# Patient Record
Sex: Male | Born: 1935 | Race: White | Hispanic: No | Marital: Single | Smoking: Never smoker
Health system: Southern US, Community
[De-identification: ages and names within clinical notes are randomized; demographics above are authoritative.]

## PROBLEM LIST (undated history)

## (undated) DIAGNOSIS — R410 Disorientation, unspecified: Secondary | ICD-10-CM

## (undated) DIAGNOSIS — I639 Cerebral infarction, unspecified: Secondary | ICD-10-CM

---

## 2011-01-25 ENCOUNTER — Emergency Department (HOSPITAL_COMMUNITY)
Admission: EM | Admit: 2011-01-25 | Discharge: 2011-01-25 | Disposition: A | Discharge status: Skilled Nursing Facility | Payer: Medicare Other | Attending: Emergency Medicine | Admitting: Emergency Medicine

## 2011-01-25 ENCOUNTER — Encounter: Payer: Self-pay | Admitting: *Deleted

## 2011-01-25 DIAGNOSIS — R5383 Other fatigue: Secondary | ICD-10-CM | POA: Insufficient documentation

## 2011-01-25 DIAGNOSIS — Z9181 History of falling: Secondary | ICD-10-CM | POA: Insufficient documentation

## 2011-01-25 DIAGNOSIS — W19XXXA Unspecified fall, initial encounter: Secondary | ICD-10-CM

## 2011-01-25 DIAGNOSIS — Z8673 Personal history of transient ischemic attack (TIA), and cerebral infarction without residual deficits: Secondary | ICD-10-CM | POA: Insufficient documentation

## 2011-01-25 DIAGNOSIS — R1310 Dysphagia, unspecified: Secondary | ICD-10-CM | POA: Insufficient documentation

## 2011-01-25 DIAGNOSIS — R5381 Other malaise: Secondary | ICD-10-CM | POA: Insufficient documentation

## 2011-01-25 HISTORY — DX: Cerebral infarction, unspecified: I63.9

## 2011-01-25 LAB — URINALYSIS, ROUTINE W REFLEX MICROSCOPIC
Glucose, UA: NEGATIVE mg/dL
Hgb urine dipstick: NEGATIVE
Ketones, ur: NEGATIVE mg/dL
Protein, ur: NEGATIVE mg/dL
pH: 5 (ref 5.0–8.0)

## 2011-01-25 LAB — URINE MICROSCOPIC-ADD ON

## 2011-01-25 NOTE — ED Notes (Signed)
Report called to Nursing home, awaiting transport

## 2011-01-25 NOTE — ED Provider Notes (Signed)
History     CSN: [id number] Arrival date & time: 01/25/2011  3:25 PM   None     No chief complaint on file.   (Consider location/radiation/quality/duration/timing/severity/associated sxs/prior treatment) HPI Comments: Pt with history of recent stroke sent from nursing home over concern of falls and dysphagia. Patient without complaints, denies cough or pain from falls. Pt is not on blood thinner.  Patient is a 77 y.o. male presenting with weakness. The history is provided by the EMS personnel, the nursing home and the patient.  Weakness Primary symptoms do not include headaches, loss of consciousness, altered mental status, fever, nausea or vomiting. The symptoms began 5 to 7 days ago. The symptoms are unchanged. The neurological symptoms are diffuse. Context: pt with recent CVA.  Additional symptoms include weakness and loss of balance. Medical issues also include cerebral vascular accident.    No past medical history on file.  No past surgical history on file.  No family history on file.  History  Substance Use Topics  . Smoking status: Not on file  . Smokeless tobacco: Not on file  . Alcohol Use: Not on file      Review of Systems  Constitutional: Negative for fever.  Respiratory: Negative for cough and shortness of breath.   Cardiovascular: Negative for chest pain.  Gastrointestinal: Negative for nausea, vomiting, abdominal pain and diarrhea.  Neurological: Positive for weakness and loss of balance. Negative for loss of consciousness and headaches.  Psychiatric/Behavioral: Negative for altered mental status.  All other systems reviewed and are negative.    Allergies  Review of patient's allergies indicates not on file.  Home Medications  No current outpatient prescriptions on file.  There were no vitals taken for this visit.  Physical Exam  Nursing note and vitals reviewed. Constitutional: He is oriented to person, place, and time. He appears  well-developed and well-nourished. No distress.  HENT:  Head: Normocephalic and atraumatic.  Mouth/Throat: Oropharynx is clear and moist.  Eyes: EOM are normal. Pupils are equal, round, and reactive to light.  Cardiovascular: Normal rate, regular rhythm and normal heart sounds.   Pulmonary/Chest: Effort normal and breath sounds normal. No respiratory distress. He has no wheezes. He has no rales. He exhibits no tenderness.  Abdominal: Soft. He exhibits no distension. There is no tenderness.  Musculoskeletal: Normal range of motion.  Neurological: He is alert and oriented to person, place, and time. No cranial nerve deficit. He exhibits normal muscle tone.  Skin: Skin is warm and dry.  Psychiatric: He has a normal mood and affect.    ED Course  Procedures (including critical care time)  Labs Reviewed - No data to display No results found.   No diagnosis found.    MDM   3:26 PM Pt seen and examined. Report from nursing home is that patient has been falling more and is appearing red any time he tries to swallow something. At this time, patient is without cough or fever. He does not appear septic. He does not appear to have had another stroke, just his previous deficits are noted. Will check urine as unable to ensure this is not causing worsening of underlying neuro deficits from stroke on physical exam.  Pt without evidence of urinary infection. Will return to nursing home and advise follow up with PCP for any further concerns.         Daleen Bo 01/26/11 7829

## 2011-01-25 NOTE — ED Notes (Signed)
Pt. Sent in from [facility]. Pt. Alert and oriented, speech  slurred but comprehensible, pt. Denies pain or nausea

## 2011-01-26 LAB — URINE CULTURE
Colony Count: NO GROWTH
Culture  Setup Time: 201211130106
Culture: NO GROWTH

## 2011-01-26 NOTE — ED Provider Notes (Signed)
I saw and evaluated the patient, reviewed the resident's note and I agree with the findings and plan. 78 year old male  he recently had a stroke.  He is in a rehabilitation facility where he has fallen out of the bed.  A few times.  He is in no distress.  He has no evidence of trauma to his head to neck.  Extremities, or thorax or abdomen.  There is no indication for CAT scans or radiographs of his extremities.  There is no evidence of an acute illness.  Will release him back to the nursing home for continued rehabilitation  Nicholes Stairs, MD 01/26/11 1552

## 2011-01-29 ENCOUNTER — Other Ambulatory Visit (HOSPITAL_COMMUNITY): Payer: Self-pay | Admitting: *Deleted

## 2011-01-29 DIAGNOSIS — I639 Cerebral infarction, unspecified: Secondary | ICD-10-CM

## 2011-01-29 DIAGNOSIS — T17998A Other foreign object in respiratory tract, part unspecified causing other injury, initial encounter: Secondary | ICD-10-CM

## 2011-02-03 ENCOUNTER — Ambulatory Visit (HOSPITAL_COMMUNITY)
Admission: RE | Admit: 2011-02-03 | Discharge: 2011-02-03 | Disposition: A | Discharge status: Home / Self Care | Payer: Medicare Other | Source: Ambulatory Visit | Attending: Internal Medicine | Admitting: Internal Medicine

## 2011-02-03 DIAGNOSIS — I639 Cerebral infarction, unspecified: Secondary | ICD-10-CM

## 2011-02-03 DIAGNOSIS — R1312 Dysphagia, oropharyngeal phase: Secondary | ICD-10-CM | POA: Insufficient documentation

## 2011-02-03 DIAGNOSIS — T17208A Unspecified foreign body in pharynx causing other injury, initial encounter: Secondary | ICD-10-CM | POA: Insufficient documentation

## 2011-02-03 DIAGNOSIS — Z8673 Personal history of transient ischemic attack (TIA), and cerebral infarction without residual deficits: Secondary | ICD-10-CM | POA: Insufficient documentation

## 2011-02-03 DIAGNOSIS — IMO0002 Reserved for concepts with insufficient information to code with codable children: Secondary | ICD-10-CM | POA: Insufficient documentation

## 2011-02-03 DIAGNOSIS — T17998A Other foreign object in respiratory tract, part unspecified causing other injury, initial encounter: Secondary | ICD-10-CM

## 2011-02-03 NOTE — Procedures (Signed)
Modified Barium Swallow Procedure Note Patient Details  Name: Nicholas Baxter MRN: [id number] Date of Birth: 18-Nov-1918  Today's Date: 02/03/2011   Past Medical History:  Past Medical History  Diagnosis Date  . Stroke    Past Surgical History: No past surgical history on file.    Recommendation/Prognosis  Clinical Impression Dysphagia Diagnosis: Moderate oral phase dysphagia;Severe pharyngeal phase dysphagia Clinical impression: Patient presents with a moderately severe sensory-motor based oropharyngeal dysphagia characterized by right sided oral weakness resulting in decreased bolus cohesion, delayed A-P transit, and loss of bolus over the base of the tongue followed by oro-pharyngeal and laryngeal weakness resulting in severe pharyngeal residuals post swallow. The combination of above results in aspiration of nectar thick liquids before the swallow and aspiration of honey thick and pureed residuals post swallow. Patient largely unaware of residuals, therefore not making attempts to clear with dry swallow. Chin tuck minimally effective to decrease degree of residuals. Additionally, patient with only intermittent sensation of aspiration. Cough ineffective to fully clear. Discussed results with son who verbalized understanding of high aspiration risk. At this time, current diet of pureed solids and honey thick liquids is least restrictive however aspiration risk continues to remain high.   Recommendations Solid Consistency: Dysphagia 1 (Puree) Liquid Consistency: Honey Liquid Administration via: Cup Medication Administration: Crushed with puree Supervision: Full supervision/cueing for compensatory strategies Compensations: Slow rate;Small sips/bites;Check for pocketing Postural Changes and/or Swallow Maneuvers: Chin tuck;Seated upright 90 degrees;Upright 30-60 min after meal Oral Care Recommendations: Oral care QID Prognosis Prognosis for Safe Diet Advancement: Guarded Barriers to Reach  Goals: Severity of dysphagia (advanced age) Individuals Consulted Consulted and Agree with Results and Recommendations: Family member/caregiver;Patient Family Member Consulted: son Report Sent to : Referring physician  Ferdinand Lango MA, CCC-SLP [phone] Name change in 07/2010. Correct on license as Ferdinand Lango MA, CCC-SLP   Myrtis Hopping Meryl 02/03/2011, 2:09 PM

## 2011-02-08 ENCOUNTER — Emergency Department (HOSPITAL_COMMUNITY): Payer: Medicare Other

## 2011-02-08 ENCOUNTER — Encounter (HOSPITAL_COMMUNITY): Payer: Self-pay

## 2011-02-08 ENCOUNTER — Other Ambulatory Visit: Payer: Self-pay

## 2011-02-08 ENCOUNTER — Inpatient Hospital Stay (HOSPITAL_COMMUNITY)
Admission: EM | Admit: 2011-02-08 | Discharge: 2011-02-12 | DRG: 689 | Disposition: A | Discharge status: Skilled Nursing Facility | Payer: Medicare Other | Attending: Internal Medicine | Admitting: Internal Medicine

## 2011-02-08 DIAGNOSIS — T4275XA Adverse effect of unspecified antiepileptic and sedative-hypnotic drugs, initial encounter: Secondary | ICD-10-CM | POA: Diagnosis present

## 2011-02-08 DIAGNOSIS — G9340 Encephalopathy, unspecified: Secondary | ICD-10-CM

## 2011-02-08 DIAGNOSIS — I10 Essential (primary) hypertension: Secondary | ICD-10-CM | POA: Diagnosis present

## 2011-02-08 DIAGNOSIS — I69991 Dysphagia following unspecified cerebrovascular disease: Secondary | ICD-10-CM

## 2011-02-08 DIAGNOSIS — R1310 Dysphagia, unspecified: Secondary | ICD-10-CM | POA: Diagnosis present

## 2011-02-08 DIAGNOSIS — I69922 Dysarthria following unspecified cerebrovascular disease: Secondary | ICD-10-CM

## 2011-02-08 DIAGNOSIS — Z66 Do not resuscitate: Secondary | ICD-10-CM | POA: Diagnosis present

## 2011-02-08 DIAGNOSIS — E860 Dehydration: Secondary | ICD-10-CM | POA: Diagnosis present

## 2011-02-08 DIAGNOSIS — R4182 Altered mental status, unspecified: Secondary | ICD-10-CM

## 2011-02-08 DIAGNOSIS — Z8673 Personal history of transient ischemic attack (TIA), and cerebral infarction without residual deficits: Secondary | ICD-10-CM | POA: Insufficient documentation

## 2011-02-08 DIAGNOSIS — I714 Abdominal aortic aneurysm, without rupture: Secondary | ICD-10-CM | POA: Diagnosis present

## 2011-02-08 DIAGNOSIS — N390 Urinary tract infection, site not specified: Principal | ICD-10-CM | POA: Diagnosis present

## 2011-02-08 HISTORY — DX: Disorientation, unspecified: R41.0

## 2011-02-08 LAB — RAPID URINE DRUG SCREEN, HOSP PERFORMED
Amphetamines: NOT DETECTED
Barbiturates: NOT DETECTED
Opiates: NOT DETECTED
Tetrahydrocannabinol: NOT DETECTED

## 2011-02-08 LAB — COMPREHENSIVE METABOLIC PANEL
Albumin: 3.4 g/dL — ABNORMAL LOW (ref 3.5–5.2)
Alkaline Phosphatase: 85 U/L (ref 39–117)
BUN: 21 mg/dL (ref 6–23)
CO2: 29 mEq/L (ref 19–32)
Chloride: 103 mEq/L (ref 96–112)
Creatinine, Ser: 1.43 mg/dL — ABNORMAL HIGH (ref 0.50–1.35)
GFR calc non Af Amer: 42 mL/min — ABNORMAL LOW (ref >90)
Glucose, Bld: 115 mg/dL — ABNORMAL HIGH (ref 70–99)
Potassium: 4 mEq/L (ref 3.5–5.1)
Total Bilirubin: 0.4 mg/dL (ref 0.3–1.2)

## 2011-02-08 LAB — AMMONIA: Ammonia: 23 umol/L (ref 11–60)

## 2011-02-08 LAB — DIFFERENTIAL
Basophils Relative: 0 % (ref 0–1)
Lymphocytes Relative: 18 % (ref 12–46)
Lymphs Abs: 1.2 10*3/uL (ref 0.7–4.0)
Monocytes Absolute: 0.4 10*3/uL (ref 0.1–1.0)
Monocytes Relative: 7 % (ref 3–12)
Neutro Abs: 4.7 10*3/uL (ref 1.7–7.7)
Neutrophils Relative %: 73 % (ref 43–77)

## 2011-02-08 LAB — URINALYSIS, ROUTINE W REFLEX MICROSCOPIC
Bilirubin Urine: NEGATIVE
Glucose, UA: NEGATIVE mg/dL
Specific Gravity, Urine: 1.025 (ref 1.005–1.030)

## 2011-02-08 LAB — CBC
HCT: 37.1 % — ABNORMAL LOW (ref 39.0–52.0)
Hemoglobin: 11.7 g/dL — ABNORMAL LOW (ref 13.0–17.0)
RBC: 3.65 MIL/uL — ABNORMAL LOW (ref 4.22–5.81)
WBC: 6.4 10*3/uL (ref 4.0–10.5)

## 2011-02-08 LAB — ETHANOL: Alcohol, Ethyl (B): <11 mg/dL (ref 0–11)

## 2011-02-08 LAB — URINE MICROSCOPIC-ADD ON

## 2011-02-08 MED ORDER — NALOXONE HCL 0.4 MG/ML IJ SOLN
INTRAMUSCULAR | Status: AC
  Administered 2011-02-08: 0.4 mg
  Filled 2011-02-08: qty 1

## 2011-02-08 MED ORDER — SODIUM CHLORIDE 0.9 % IV BOLUS (SEPSIS)
500.0000 mL | INTRAVENOUS | Status: AC
  Administered 2011-02-08: 500 mL via INTRAVENOUS

## 2011-02-08 MED ORDER — SODIUM CHLORIDE 0.45 % IV SOLN
INTRAVENOUS | Status: DC
  Administered 2011-02-09 – 2011-02-11 (×4): via INTRAVENOUS

## 2011-02-08 MED ORDER — ENOXAPARIN SODIUM 40 MG/0.4ML ~~LOC~~ SOLN
40.0000 mg | SUBCUTANEOUS | Status: DC
  Administered 2011-02-09 – 2011-02-11 (×4): 40 mg via SUBCUTANEOUS
  Filled 2011-02-08 (×7): qty 0.4

## 2011-02-08 MED ORDER — DEXTROSE 5 % IV SOLN
1.0000 g | Freq: Once | INTRAVENOUS | Status: AC
  Administered 2011-02-08: 1 g via INTRAVENOUS
  Filled 2011-02-08: qty 10

## 2011-02-08 MED ORDER — NALOXONE HCL 0.4 MG/ML IJ SOLN
INTRAMUSCULAR | Status: AC
  Filled 2011-02-08: qty 1

## 2011-02-08 NOTE — ED Notes (Signed)
FAO:ZHYQ<MV> Expected date:02/08/11<BR> Expected time:12:01 PM<BR> Means of arrival:Ambulance<BR> Comments:<BR> M32 - 89yoM Decreased LOC

## 2011-02-08 NOTE — ED Notes (Signed)
Per EMS.Marland KitchenMarland KitchenPt was reported to have a sudden onset of altered mental status at around 1115 this morning.  When they arrived he was not responding and his RR was 8 and was sinus brady in the 40s.  He was 84% on room air.  He was 100% on NRB.  They gave 2mg  Narcan at 12pm.  His HR picked up and he was opening his eyes at that time.  He had had some pain meds this AM.  Hx dementia and HTN.

## 2011-02-08 NOTE — ED Provider Notes (Cosign Needed Addendum)
History     CSN: [id number] Arrival date & time: 02/08/2011 12:11 PM   First MD Initiated Contact with Patient 02/08/11 1232      Chief Complaint  Patient presents with  . Altered Mental Status   elderly male, with a known history of recent stroke. Presents from the nursing home, last seen, normal at 11:15 this morning. Staff went to check on patient and found him not responding with a low respiratory rate and heart rate in the 40s. Patient was placed on percent nonrebreather and was given 2 mg of Narcan. Apparently, his mental status improved somewhat. He does have a known history of dementia and hypertension. Patient is unable to answer any other questions at this time. Appears lethargic, however, it is beginning to move his arms. His baseline mental status is unknown. He is slightly hypertensive in the ED, 186/82. No fever. No history of trauma.  (Consider location/radiation/quality/duration/timing/severity/associated sxs/prior treatment) HPI  Past Medical History  Diagnosis Date  . Stroke     History reviewed. No pertinent past surgical history.  History reviewed. No pertinent family history.  History  Substance Use Topics  . Smoking status: Not on file  . Smokeless tobacco: Not on file  . Alcohol Use: No      Review of Systems  Unable to perform ROS   Allergies  Review of patient's allergies indicates no known allergies.  Home Medications   Current Outpatient Rx  Name Route Sig Dispense Refill  . ACETAMINOPHEN 325 MG PO TABS Oral Take 650 mg by mouth every 6 (six) hours as needed. pain    . B COMPLEX-C PO TABS Oral Take 1 tablet by mouth daily.     Marland Kitchen BISACODYL 10 MG RE SUPP Rectal Place 10 mg rectally as needed. For constipation. Give only if no relief from Milk of Magnesia.    Marland Kitchen CITALOPRAM HYDROBROMIDE 20 MG PO TABS Oral Take 20 mg by mouth daily.      Marland Kitchen DOCUSATE SODIUM 100 MG PO CAPS Oral Take 100 mg by mouth at bedtime.      Marland Kitchen VITAMIN D2 2000 UNITS PO TABS  Oral Take 1 tablet by mouth 2 (two) times daily.     Marland Kitchen FERROUS SULFATE 325 (65 FE) MG PO TABS Oral Take 325 mg by mouth daily with breakfast.      . HYDROCODONE-ACETAMINOPHEN 5-325 MG PO TABS Oral Take 1 tablet by mouth every 6 (six) hours as needed. pain    . LIDOCAINE 5 % EX PTCH Transdermal Place 1 patch onto the skin every 12 (twelve) hours. Remove & Discard patch within 12 hours or as directed by MD    . MAGNESIUM HYDROXIDE 400 MG/5ML PO SUSP Oral Take 30 mLs by mouth daily as needed. constipation    . MELATONIN 3 MG PO TABS Oral Take 1 tablet by mouth daily.     Marland Kitchen METOPROLOL TARTRATE 50 MG PO TABS Oral Take 25 mg by mouth 2 (two) times daily.     Marland Kitchen PANTOPRAZOLE SODIUM 40 MG PO TBEC Oral Take 40 mg by mouth daily.      . PSYLLIUM 0.52 G PO CAPS Oral Take 0.52 g by mouth at bedtime.     . SENNA 8.6 MG PO TABS Oral Take 2 tablets by mouth daily.     . TRAZODONE HCL 50 MG PO TABS Oral Take 50 mg by mouth at bedtime.     Marland Kitchen VITAMIN C 500 MG PO TABS Oral Take 500 mg by  mouth daily.      Marland Kitchen FLEET ENEMA RE Rectal Place 1 applicator rectally once as needed. For constipation. Not relieved by Milk of Magnesia or Bisacodyl suppository      BP 165/73  Pulse 62  Temp(Src) 97.8 F (36.6 C) (Oral)  Resp 13  SpO2 100%  Physical Exam  Constitutional: He appears well-developed and well-nourished.  HENT:  Head: Normocephalic and atraumatic.  Eyes: Conjunctivae are normal. Pupils are equal, round, and reactive to light.  Neck: Normal range of motion. Neck supple.  Cardiovascular: Normal rate and regular rhythm.  Exam reveals no gallop and no friction rub.   No murmur heard. Pulmonary/Chest: Breath sounds normal. He has no wheezes. He has no rales. He exhibits no tenderness.  Abdominal: Soft. Bowel sounds are normal. He exhibits no distension. There is no tenderness. There is no rebound and no guarding.  Musculoskeletal: Normal range of motion. He exhibits no edema and no tenderness.  Neurological:        The patient initially not responding to commands, however, we'll move both arms purposefully. His pupils are equal, round . No nystagmus noted. Deep tendon reflexes equal and symmetric. Downgoing toes bilaterally. Unable to fully assess cranial nerves and full. Motor exam or cerebellar function to 2 lack of patient cooperation./Lethargy.  Skin: Skin is warm and dry. No rash noted.  Psychiatric: He has a normal mood and affect.    ED Course  Procedures (including critical care time)  Labs Reviewed  URINALYSIS, ROUTINE W REFLEX MICROSCOPIC - Abnormal; Notable for the following:    Color, Urine RED (*) BIOCHEMICALS MAY BE AFFECTED BY COLOR   Appearance CLOUDY (*)    Hgb urine dipstick LARGE (*)    Ketones, ur TRACE (*)    Protein, ur 100 (*)    Leukocytes, UA MODERATE (*)    All other components within normal limits  CBC - Abnormal; Notable for the following:    RBC 3.65 (*)    Hemoglobin 11.7 (*)    HCT 37.1 (*)    MCV 101.6 (*)    RDW 16.9 (*)    All other components within normal limits  COMPREHENSIVE METABOLIC PANEL - Abnormal; Notable for the following:    Glucose, Bld 115 (*)    Creatinine, Ser 1.43 (*)    Albumin 3.4 (*)    GFR calc non Af Amer 42 (*)    GFR calc Af Amer 48 (*)    All other components within normal limits  URINE MICROSCOPIC-ADD ON - Abnormal; Notable for the following:    Squamous Epithelial / LPF FEW (*)    Bacteria, UA FEW (*)    All other components within normal limits  AMMONIA  ETHANOL  DIFFERENTIAL  URINE RAPID DRUG SCREEN (HOSP PERFORMED)   Ct Head Wo Contrast  02/08/2011  *RADIOLOGY REPORT*  Clinical Data: Altered mental status  CT HEAD WITHOUT CONTRAST  Technique:  Contiguous axial images were obtained from the base of the skull through the vertex without contrast.  Comparison: None.  Findings: Chronic ischemic changes and global atrophy are present. No mass effect, midline shift, or acute intracranial hemorrhage. Mucous material partially  fills the right sphenoid sinus. Right mastoid air cells are clear.  Minimal fluid in the left mastoid air cells.  Cranium is intact.  IMPRESSION: Other than fluid in the left mastoid air cells and inflammatory changes in the sphenoid sinus, there is no acute intracranial pathology.  Chronic ischemic changes and atrophy are noted.  Original Report Authenticated By: Donavan Burnet, M.D.   Dg Chest Port 1 View  02/08/2011  *RADIOLOGY REPORT*  Clinical Data: Larey Seat status changes.  Shortness of breath.  PORTABLE CHEST - 1 VIEW  Comparison: None  Findings: The cardiac silhouette, mediastinal and hilar contours are within normal limits for age.  Mild tortuosity and calcification of the thoracic aorta.  The lungs are clear acute process.  No pleural effusions or pneumothorax.  The bony thorax is intact.  IMPRESSION: No acute cardiopulmonary findings.  Original Report Authenticated By: P. Loralie Champagne, M.D.     No diagnosis found.    MDM  Pt is seen and examined;  Initial history and physical completed.  Will follow.          Gianny Sabino A. Patrica Duel, MD 02/08/11 1242  3:08 PM  Discussed with Neurology, agrees with current workup;  Not tPA candidate,   Theron Arista A. Patrica Duel, MD 02/08/11 1253  Results for orders placed during the hospital encounter of 02/08/11  URINALYSIS, ROUTINE W REFLEX MICROSCOPIC      Component Value Range   Color, Urine RED (*) YELLOW    Appearance CLOUDY (*) CLEAR    Specific Gravity, Urine 1.025  1.005 - 1.030    pH 6.0  5.0 - 8.0    Glucose, UA NEGATIVE  NEGATIVE (mg/dL)   Hgb urine dipstick LARGE (*) NEGATIVE    Bilirubin Urine NEGATIVE  NEGATIVE    Ketones, ur TRACE (*) NEGATIVE (mg/dL)   Protein, ur 621 (*) NEGATIVE (mg/dL)   Urobilinogen, UA 1.0  0.0 - 1.0 (mg/dL)   Nitrite NEGATIVE  NEGATIVE    Leukocytes, UA MODERATE (*) NEGATIVE   AMMONIA      Component Value Range   Ammonia 23  11 - 60 (umol/L)  ETHANOL      Component Value Range   Alcohol, Ethyl (B) <11   0 - 11 (mg/dL)  CBC      Component Value Range   WBC 6.4  4.0 - 10.5 (K/uL)   RBC 3.65 (*) 4.22 - 5.81 (MIL/uL)   Hemoglobin 11.7 (*) 13.0 - 17.0 (g/dL)   HCT 30.8 (*) 65.7 - 52.0 (%)   MCV 101.6 (*) 78.0 - 100.0 (fL)   MCH 32.1  26.0 - 34.0 (pg)   MCHC 31.5  30.0 - 36.0 (g/dL)   RDW 84.6 (*) 96.2 - 15.5 (%)   Platelets 263  150 - 400 (K/uL)  DIFFERENTIAL      Component Value Range   Neutrophils Relative 73  43 - 77 (%)   Neutro Abs 4.7  1.7 - 7.7 (K/uL)   Lymphocytes Relative 18  12 - 46 (%)   Lymphs Abs 1.2  0.7 - 4.0 (K/uL)   Monocytes Relative 7  3 - 12 (%)   Monocytes Absolute 0.4  0.1 - 1.0 (K/uL)   Eosinophils Relative 2  0 - 5 (%)   Eosinophils Absolute 0.1  0.0 - 0.7 (K/uL)   Basophils Relative 0  0 - 1 (%)   Basophils Absolute 0.0  0.0 - 0.1 (K/uL)  COMPREHENSIVE METABOLIC PANEL      Component Value Range   Sodium 140  135 - 145 (mEq/L)   Potassium 4.0  3.5 - 5.1 (mEq/L)   Chloride 103  96 - 112 (mEq/L)   CO2 29  19 - 32 (mEq/L)   Glucose, Bld 115 (*) 70 - 99 (mg/dL)   BUN 21  6 - 23 (mg/dL)   Creatinine, Ser  1.43 (*) 0.50 - 1.35 (mg/dL)   Calcium 8.9  8.4 - 62.1 (mg/dL)   Total Protein 7.0  6.0 - 8.3 (g/dL)   Albumin 3.4 (*) 3.5 - 5.2 (g/dL)   AST 15  0 - 37 (U/L)   ALT 12  0 - 53 (U/L)   Alkaline Phosphatase 85  39 - 117 (U/L)   Total Bilirubin 0.4  0.3 - 1.2 (mg/dL)   GFR calc non Af Amer 42 (*) >90 (mL/min)   GFR calc Af Amer 48 (*) >90 (mL/min)  URINE RAPID DRUG SCREEN (HOSP PERFORMED)      Component Value Range   Opiates NONE DETECTED  NONE DETECTED    Cocaine NONE DETECTED  NONE DETECTED    Benzodiazepines NONE DETECTED  NONE DETECTED    Amphetamines NONE DETECTED  NONE DETECTED    Tetrahydrocannabinol NONE DETECTED  NONE DETECTED    Barbiturates NONE DETECTED  NONE DETECTED   URINE MICROSCOPIC-ADD ON      Component Value Range   Squamous Epithelial / LPF FEW (*) RARE    WBC, UA 11-20  <3 (WBC/hpf)   RBC / HPF TOO NUMEROUS TO COUNT  <3 (RBC/hpf)     Bacteria, UA FEW (*) RARE    Ct Head Wo Contrast  02/08/2011  *RADIOLOGY REPORT*  Clinical Data: Altered mental status  CT HEAD WITHOUT CONTRAST  Technique:  Contiguous axial images were obtained from the base of the skull through the vertex without contrast.  Comparison: None.  Findings: Chronic ischemic changes and global atrophy are present. No mass effect, midline shift, or acute intracranial hemorrhage. Mucous material partially fills the right sphenoid sinus. Right mastoid air cells are clear.  Minimal fluid in the left mastoid air cells.  Cranium is intact.  IMPRESSION: Other than fluid in the left mastoid air cells and inflammatory changes in the sphenoid sinus, there is no acute intracranial pathology.  Chronic ischemic changes and atrophy are noted.  Original Report Authenticated By: Donavan Burnet, M.D.   Dg Chest Port 1 View  02/08/2011  *RADIOLOGY REPORT*  Clinical Data: Larey Seat status changes.  Shortness of breath.  PORTABLE CHEST - 1 VIEW  Comparison: None  Findings: The cardiac silhouette, mediastinal and hilar contours are within normal limits for age.  Mild tortuosity and calcification of the thoracic aorta.  The lungs are clear acute process.  No pleural effusions or pneumothorax.  The bony thorax is intact.  IMPRESSION: No acute cardiopulmonary findings.  Original Report Authenticated By: P. Loralie Champagne, M.D.   Dg Swallowing Func-no Report  02/03/2011  CLINICAL DATA: CVA; aspiration risk   FLUOROSCOPY FOR SWALLOWING FUNCTION STUDY:  Fluoroscopy was provided for swallowing function study, which was  administered by a speech pathologist.  Final results and recommendations  from this study are contained within the speech pathology report.      3:08 PM  Patient has been moved to room 10, mental status improving, but still groggy. Family is at bedside. CT scan of the head showed no acute intracranial pathology other than some fluid in the mastoid air cells and inflammation in the  sphenoid sinus.     Abayomi Pattison A. Patrica Duel, MD 02/08/11 1344  Discussed with [facility].  Pt has been accepted for admission/transfer.  Stable.     Kalany Diekmann A. Patrica Duel, MD 02/08/11 1615   Date: 02/08/2011  Rate: 68  Rhythm: normal sinus rhythm  QRS Axis: left  Intervals: normal  ST/T Wave abnormalities: nonspecific ST changes  Conduction Disutrbances:left  anterior fascicular block  Narrative Interpretation:   Old EKG Reviewed: none available    Jordan Caraveo A. Patrica Duel, MD 02/08/11 [phone]

## 2011-02-08 NOTE — ED Notes (Signed)
Patient transported to X-ray. Will get labs when pt returns to room.

## 2011-02-08 NOTE — H&P (Signed)
PCP:  No primary provider on file.   DOA:  02/08/2011 12:11 PM  Chief Complaint:  Altered mental status  HPI: This is a 77 year old gentleman with history of hypertension, depression, recent stroke approximately 6 weeks ago where he was treated at [facility] medical center. Patient is currently residing at [facility] skilled nursing facility for rehabilitation. He was brought to the emergency room today when he was found to be increasingly lethargic. He was very difficult to arouse and per ER records was noted to be hypoxic and possibly bradycardic. He was brought to the emergency room where he was provided with oxygen and IV fluids. He was also given a dose of Narcan which may have improved his symptoms as well. Further evaluation showed that the patient had a urinalysis which suggested a urinary tract infection. He also had elevated BUN creatinine and clinically appeared dehydrated.  The patient is confused and is unable to provide any history. After speaking to the patient's son over the phone it was reported that patient has had significant aphasia since his stroke. He speech is difficult to comprehend at baseline. He also ambulates minimally and spends most of his time in a chair. His son reports that normally he is alert and aware of his surroundings does have difficulty communicating due to his neurologic deficits. The patient has been referred for further evaluation treatment  Allergies: No Known Allergies  Prior to Admission medications   Medication Sig Start Date End Date Taking? Authorizing Provider  acetaminophen (TYLENOL) 325 MG tablet Take 650 mg by mouth every 6 (six) hours as needed. pain   Yes Historical Provider, MD  B Complex-C (B-COMPLEX WITH VITAMIN C) tablet Take 1 tablet by mouth daily.    Yes Historical Provider, MD  bisacodyl (DULCOLAX) 10 MG suppository Place 10 mg rectally as needed. For constipation. Give only if no relief from Milk of Magnesia.   Yes Historical Provider,  MD  citalopram (CELEXA) 20 MG tablet Take 20 mg by mouth daily.     Yes Historical Provider, MD  docusate sodium (COLACE) 100 MG capsule Take 100 mg by mouth at bedtime.     Yes Historical Provider, MD  Ergocalciferol (VITAMIN D2) 2000 UNITS TABS Take 1 tablet by mouth 2 (two) times daily.    Yes Historical Provider, MD  ferrous sulfate 325 (65 FE) MG tablet Take 325 mg by mouth daily with breakfast.     Yes Historical Provider, MD  HYDROcodone-acetaminophen (NORCO) 5-325 MG per tablet Take 1 tablet by mouth every 6 (six) hours as needed. pain   Yes Historical Provider, MD  lidocaine (LIDODERM) 5 % Place 1 patch onto the skin every 12 (twelve) hours. Remove & Discard patch within 12 hours or as directed by MD   Yes Historical Provider, MD  magnesium hydroxide (MILK OF MAGNESIA) 400 MG/5ML suspension Take 30 mLs by mouth daily as needed. constipation   Yes Historical Provider, MD  Melatonin 3 MG TABS Take 1 tablet by mouth daily.    Yes Historical Provider, MD  metoprolol (LOPRESSOR) 50 MG tablet Take 25 mg by mouth 2 (two) times daily.    Yes Historical Provider, MD  pantoprazole (PROTONIX) 40 MG tablet Take 40 mg by mouth daily.     Yes Historical Provider, MD  psyllium (REGULOID) 0.52 G capsule Take 0.52 g by mouth at bedtime.    Yes Historical Provider, MD  senna (SENOKOT) 8.6 MG TABS Take 2 tablets by mouth daily.    Yes Historical Provider, MD  traZODone (DESYREL) 50 MG tablet Take 50 mg by mouth at bedtime.    Yes Historical Provider, MD  vitamin C (ASCORBIC ACID) 500 MG tablet Take 500 mg by mouth daily.     Yes Historical Provider, MD  Sodium Phosphates (FLEET ENEMA RE) Place 1 applicator rectally once as needed. For constipation. Not relieved by Milk of Magnesia or Bisacodyl suppository    Historical Provider, MD    Past Medical History  Diagnosis Date  . Stroke   . Confusion   . Dysphagia     History reviewed. No pertinent past surgical history.  Social History:  does not have a  smoking history on file. He does not have any smokeless tobacco history on file. He reports that he does not drink alcohol or use illicit drugs.  History reviewed. No pertinent family history.  Review of Systems:  Unable to assess due to patient's mental status  Physical Exam:  Filed Vitals:   02/08/11 1227 02/08/11 1336 02/08/11 1628  BP: 186/82 165/73 180/76  Pulse: 62  88  Temp: 98.1 F (36.7 C) 97.8 F (36.6 C) 97.5 F (36.4 C)  TempSrc: Rectal Oral Oral  Resp: 14 13 18   SpO2: 100% 100% 96%    Constitutional: Vital signs reviewed.  Patient is confused, lying in bed, disoriented, cachectic  Head: Normocephalic and atraumatic Ear: TM normal bilaterally Mouth: no erythema or exudates, mucous membranes are dry Eyes: PERRL, EOMI, conjunctivae normal, No scleral icterus.  Neck: Supple, Trachea midline normal ROM, No JVD, mass, thyromegaly, or carotid bruit present.  Cardiovascular: RRR, S1 normal, S2 normal, no MRG, pulses symmetric and intact bilaterally Pulmonary/Chest: CTAB, no wheezes, rales, or rhonchi Abdominal: Soft. Non-tender, non-distended, bowel sounds are normal, no masses, organomegaly, or guarding present.  GU: no CVA tenderness Musculoskeletal: No joint deformities, erythema, or stiffness, ROM full and no nontender Ext: no edema and no cyanosis, pulses palpable bilaterally (DP and PT) Hematology: no cervical, inginal, or axillary adenopathy.  Neurological: Patient does not cooperative with exam  Skin: Warm, dry and intact. No rash, cyanosis, or clubbing.  .   Labs on Admission:  Results for orders placed during the hospital encounter of 02/08/11 (from the past 48 hour(s))  URINALYSIS, ROUTINE W REFLEX MICROSCOPIC     Status: Abnormal   Collection Time   02/08/11  1:18 PM      Component Value Range Comment   Color, Urine RED (*) YELLOW  BIOCHEMICALS MAY BE AFFECTED BY COLOR   Appearance CLOUDY (*) CLEAR     Specific Gravity, Urine 1.025  1.005 - 1.030      pH 6.0  5.0 - 8.0     Glucose, UA NEGATIVE  NEGATIVE (mg/dL)    Hgb urine dipstick LARGE (*) NEGATIVE     Bilirubin Urine NEGATIVE  NEGATIVE     Ketones, ur TRACE (*) NEGATIVE (mg/dL)    Protein, ur 161 (*) NEGATIVE (mg/dL)    Urobilinogen, UA 1.0  0.0 - 1.0 (mg/dL)    Nitrite NEGATIVE  NEGATIVE     Leukocytes, UA MODERATE (*) NEGATIVE    URINE MICROSCOPIC-ADD ON     Status: Abnormal   Collection Time   02/08/11  1:18 PM      Component Value Range Comment   Squamous Epithelial / LPF FEW (*) RARE     WBC, UA 11-20  <3 (WBC/hpf)    RBC / HPF TOO NUMEROUS TO COUNT  <3 (RBC/hpf)    Bacteria, UA FEW (*) RARE  AMMONIA     Status: Normal   Collection Time   02/08/11  1:32 PM      Component Value Range Comment   Ammonia 23  11 - 60 (umol/L)   ETHANOL     Status: Normal   Collection Time   02/08/11  1:32 PM      Component Value Range Comment   Alcohol, Ethyl (B) <11  0 - 11 (mg/dL)   CBC     Status: Abnormal   Collection Time   02/08/11  1:32 PM      Component Value Range Comment   WBC 6.4  4.0 - 10.5 (K/uL)    RBC 3.65 (*) 4.22 - 5.81 (MIL/uL)    Hemoglobin 11.7 (*) 13.0 - 17.0 (g/dL)    HCT 46.9 (*) 62.9 - 52.0 (%)    MCV 101.6 (*) 78.0 - 100.0 (fL)    MCH 32.1  26.0 - 34.0 (pg)    MCHC 31.5  30.0 - 36.0 (g/dL)    RDW 52.8 (*) 41.3 - 15.5 (%)    Platelets 263  150 - 400 (K/uL)   DIFFERENTIAL     Status: Normal   Collection Time   02/08/11  1:32 PM      Component Value Range Comment   Neutrophils Relative 73  43 - 77 (%)    Neutro Abs 4.7  1.7 - 7.7 (K/uL)    Lymphocytes Relative 18  12 - 46 (%)    Lymphs Abs 1.2  0.7 - 4.0 (K/uL)    Monocytes Relative 7  3 - 12 (%)    Monocytes Absolute 0.4  0.1 - 1.0 (K/uL)    Eosinophils Relative 2  0 - 5 (%)    Eosinophils Absolute 0.1  0.0 - 0.7 (K/uL)    Basophils Relative 0  0 - 1 (%)    Basophils Absolute 0.0  0.0 - 0.1 (K/uL)   COMPREHENSIVE METABOLIC PANEL     Status: Abnormal   Collection Time   02/08/11  1:32 PM       Component Value Range Comment   Sodium 140  135 - 145 (mEq/L)    Potassium 4.0  3.5 - 5.1 (mEq/L)    Chloride 103  96 - 112 (mEq/L)    CO2 29  19 - 32 (mEq/L)    Glucose, Bld 115 (*) 70 - 99 (mg/dL)    BUN 21  6 - 23 (mg/dL)    Creatinine, Ser 2.44 (*) 0.50 - 1.35 (mg/dL)    Calcium 8.9  8.4 - 10.5 (mg/dL)    Total Protein 7.0  6.0 - 8.3 (g/dL)    Albumin 3.4 (*) 3.5 - 5.2 (g/dL)    AST 15  0 - 37 (U/L)    ALT 12  0 - 53 (U/L)    Alkaline Phosphatase 85  39 - 117 (U/L)    Total Bilirubin 0.4  0.3 - 1.2 (mg/dL)    GFR calc non Af Amer 42 (*) >90 (mL/min)    GFR calc Af Amer 48 (*) >90 (mL/min)   URINE RAPID DRUG SCREEN (HOSP PERFORMED)     Status: Normal   Collection Time   02/08/11  1:42 PM      Component Value Range Comment   Opiates NONE DETECTED  NONE DETECTED     Cocaine NONE DETECTED  NONE DETECTED     Benzodiazepines NONE DETECTED  NONE DETECTED     Amphetamines NONE DETECTED  NONE DETECTED  Tetrahydrocannabinol NONE DETECTED  NONE DETECTED     Barbiturates NONE DETECTED  NONE DETECTED      Radiological Exams on Admission: No results found.  Assessment/Plan Principal Problem:  *Encephalopathy acute Active Problems:  UTI (urinary tract infection)  Dehydration  Dysphagia  HTN (hypertension)  Plan:  Patient will be admitted to a regular bed. He'll be given antibiotic coverage with Rocephin. We'll also continue IV fluids for rehydration. We will continue his outpatient diet. Will have physical therapy to continue to work with the patient. Will monitor his progression and patient will likely be able to discharge back to his skilled nursing facility in the next 24-48 hours.  He is a DNR, this was confirmed with his son.  Time Spent on Admission:  Idara Woodside 02/08/2011, 5:08 PM

## 2011-02-09 ENCOUNTER — Encounter (HOSPITAL_COMMUNITY): Payer: Self-pay | Admitting: *Deleted

## 2011-02-09 LAB — CBC
MCH: 32 pg (ref 26.0–34.0)
MCHC: 32.2 g/dL (ref 30.0–36.0)
MCV: 99.4 fL (ref 78.0–100.0)
Platelets: 236 10*3/uL (ref 150–400)
RBC: 3.41 MIL/uL — ABNORMAL LOW (ref 4.22–5.81)

## 2011-02-09 LAB — BASIC METABOLIC PANEL
BUN: 19 mg/dL (ref 6–23)
CO2: 28 mEq/L (ref 19–32)
Calcium: 8.5 mg/dL (ref 8.4–10.5)
Creatinine, Ser: 1.27 mg/dL (ref 0.50–1.35)
Glucose, Bld: 99 mg/dL (ref 70–99)

## 2011-02-09 LAB — GLUCOSE, CAPILLARY: Glucose-Capillary: 120 mg/dL — ABNORMAL HIGH (ref 70–99)

## 2011-02-09 MED ORDER — VITAMIN D3 25 MCG (1000 UNIT) PO TABS
2000.0000 [IU] | ORAL_TABLET | Freq: Two times a day (BID) | ORAL | Status: DC
  Administered 2011-02-09 – 2011-02-12 (×8): 2000 [IU] via ORAL
  Filled 2011-02-09 (×11): qty 2

## 2011-02-09 MED ORDER — DEXTROSE 5 % IV SOLN
1.0000 g | INTRAVENOUS | Status: DC
  Administered 2011-02-09: 01:00:00 via INTRAVENOUS
  Administered 2011-02-10 – 2011-02-12 (×3): 1 g via INTRAVENOUS
  Filled 2011-02-09 (×6): qty 10

## 2011-02-09 MED ORDER — VITAMIN D2 50 MCG (2000 UT) PO TABS: 1.0000 | ORAL_TABLET | Freq: Two times a day (BID) | ORAL | Status: DC

## 2011-02-09 MED ORDER — CITALOPRAM HYDROBROMIDE 20 MG PO TABS
20.0000 mg | ORAL_TABLET | Freq: Every day | ORAL | Status: DC
  Administered 2011-02-09 – 2011-02-12 (×4): 20 mg via ORAL
  Filled 2011-02-09 (×5): qty 1

## 2011-02-09 MED ORDER — METOPROLOL TARTRATE 25 MG PO TABS
25.0000 mg | ORAL_TABLET | Freq: Two times a day (BID) | ORAL | Status: DC
  Administered 2011-02-09 – 2011-02-12 (×8): 25 mg via ORAL
  Filled 2011-02-09 (×12): qty 1

## 2011-02-09 MED ORDER — VITAMIN C 500 MG PO TABS
500.0000 mg | ORAL_TABLET | Freq: Every day | ORAL | Status: DC
  Administered 2011-02-09 – 2011-02-12 (×4): 500 mg via ORAL
  Filled 2011-02-09 (×5): qty 1

## 2011-02-09 MED ORDER — TRAZODONE HCL 50 MG PO TABS
50.0000 mg | ORAL_TABLET | Freq: Every day | ORAL | Status: DC
  Administered 2011-02-09 – 2011-02-11 (×3): 50 mg via ORAL
  Filled 2011-02-09 (×5): qty 1

## 2011-02-09 MED ORDER — LIDOCAINE 5 % EX PTCH
1.0000 | MEDICATED_PATCH | Freq: Every day | CUTANEOUS | Status: DC
  Administered 2011-02-09 – 2011-02-11 (×4): 1 via TRANSDERMAL
  Filled 2011-02-09 (×6): qty 1

## 2011-02-09 MED ORDER — MAGNESIUM HYDROXIDE 400 MG/5ML PO SUSP: 30.0000 mL | Freq: Every day | ORAL | Status: DC | PRN

## 2011-02-09 MED ORDER — FOOD THICKENER (THICKENUP CLEAR)
1.0000 | ORAL | Status: DC | PRN
  Filled 2011-02-09: qty 1.4

## 2011-02-09 MED ORDER — B COMPLEX-C PO TABS
1.0000 | ORAL_TABLET | Freq: Every day | ORAL | Status: DC
  Administered 2011-02-09 – 2011-02-12 (×4): 1 via ORAL
  Filled 2011-02-09 (×5): qty 1

## 2011-02-09 MED ORDER — FERROUS SULFATE 325 (65 FE) MG PO TABS
325.0000 mg | ORAL_TABLET | Freq: Every day | ORAL | Status: DC
  Administered 2011-02-09 – 2011-02-12 (×4): 325 mg via ORAL
  Filled 2011-02-09 (×6): qty 1

## 2011-02-09 MED ORDER — DOCUSATE SODIUM 100 MG PO CAPS
100.0000 mg | ORAL_CAPSULE | Freq: Every day | ORAL | Status: DC
  Administered 2011-02-09: 100 mg via ORAL
  Filled 2011-02-09 (×5): qty 1

## 2011-02-09 MED ORDER — BISACODYL 10 MG RE SUPP: 10.0000 mg | RECTAL | Status: DC | PRN

## 2011-02-09 MED ORDER — SENNA 8.6 MG PO TABS
2.0000 | ORAL_TABLET | Freq: Every day | ORAL | Status: DC
  Administered 2011-02-09 – 2011-02-12 (×4): 17.2 mg via ORAL
  Filled 2011-02-09 (×4): qty 2

## 2011-02-09 MED ORDER — CLONIDINE HCL 0.2 MG PO TABS
0.2000 mg | ORAL_TABLET | Freq: Four times a day (QID) | ORAL | Status: DC | PRN
  Filled 2011-02-09: qty 1

## 2011-02-09 MED ORDER — ACETAMINOPHEN 325 MG PO TABS
650.0000 mg | ORAL_TABLET | Freq: Four times a day (QID) | ORAL | Status: DC | PRN
  Administered 2011-02-10: 650 mg via ORAL
  Filled 2011-02-09: qty 2

## 2011-02-09 MED ORDER — PANTOPRAZOLE SODIUM 40 MG PO TBEC
40.0000 mg | DELAYED_RELEASE_TABLET | Freq: Every day | ORAL | Status: DC
  Administered 2011-02-09 – 2011-02-12 (×4): 40 mg via ORAL
  Filled 2011-02-09 (×5): qty 1

## 2011-02-09 MED ORDER — STARCH (THICKENING) PO POWD: ORAL | Status: DC | PRN

## 2011-02-09 NOTE — Progress Notes (Signed)
Subjective: Patient appears to be more awake today, trying to communicate.  Objective:  Vital signs in last 24 hours:  Filed Vitals:   02/08/11 2027 02/09/11 0148 02/09/11 0352 02/09/11 0500  BP: 159/77 155/69 142/95 183/89  Pulse: 91 83 71 71  Temp: 98.1 F (36.7 C) 97.7 F (36.5 C) 98.6 F (37 C) 97.5 F (36.4 C)  TempSrc: Axillary Axillary Oral Oral  Resp:  20 20 18   Weight:    59.829 kg (131 lb 14.4 oz)  SpO2:  100% 96% 99%    Intake/Output from previous day:   Intake/Output Summary (Last 24 hours) at 02/09/11 1845 Last data filed at 02/09/11 1700  Gross per 24 hour  Intake    360 ml  Output    625 ml  Net   -265 ml    Physical Exam: General: Alert, awake,  in no acute distress. HEENT: No bruits, no goiter. Moist mucous membranes, no scleral icterus, no conjunctival pallor. Heart: Regular rate and rhythm, without murmurs, rubs, gallops. Lungs: Clear to auscultation bilaterally. No wheezing, no rhonchi, no rales.  Abdomen: Soft, nontender, nondistended, positive bowel sounds. Extremities: No clubbing cyanosis or edema,  positive pedal pulses.     Lab Results:  Basic Metabolic Panel:    Component Value Date/Time   NA 139 02/09/2011 0352   K 3.9 02/09/2011 0352   CL 105 02/09/2011 0352   CO2 28 02/09/2011 0352   BUN 19 02/09/2011 0352   CREATININE 1.27 02/09/2011 0352   GLUCOSE 99 02/09/2011 0352   CALCIUM 8.5 02/09/2011 0352   CBC:    Component Value Date/Time   WBC 9.3 02/09/2011 0352   HGB 10.9* 02/09/2011 0352   HCT 33.9* 02/09/2011 0352   PLT 236 02/09/2011 0352   MCV 99.4 02/09/2011 0352   NEUTROABS 4.7 02/08/2011 1332   LYMPHSABS 1.2 02/08/2011 1332   MONOABS 0.4 02/08/2011 1332   EOSABS 0.1 02/08/2011 1332   BASOSABS 0.0 02/08/2011 1332      Lab 02/09/11 0352 02/08/11 1332  WBC 9.3 6.4  HGB 10.9* 11.7*  HCT 33.9* 37.1*  PLT 236 263  MCV 99.4 101.6*  MCH 32.0 32.1  MCHC 32.2 31.5  RDW 16.8* 16.9*  LYMPHSABS -- 1.2  MONOABS  -- 0.4  EOSABS -- 0.1  BASOSABS -- 0.0  BANDABS -- --    Lab 02/09/11 0352 02/08/11 1332  NA 139 140  K 3.9 4.0  CL 105 103  CO2 28 29  GLUCOSE 99 115*  BUN 19 21  CREATININE 1.27 1.43*  CALCIUM 8.5 8.9  MG -- --   No results found for this basename: INR:5,PROTIME:5 in the last 168 hours Cardiac markers: No results found for this basename: CK:3,CKMB:3,TROPONINI:3,MYOGLOBIN:3 in the last 168 hours No results found for this basename: POCBNP:3 in the last 168 hours No results found for this or any previous visit (from the past 240 hour(s)).  Studies/Results: Ct Head Wo Contrast  02/08/2011  *RADIOLOGY REPORT*  Clinical Data: Altered mental status  CT HEAD WITHOUT CONTRAST  Technique:  Contiguous axial images were obtained from the base of the skull through the vertex without contrast.  Comparison: None.  Findings: Chronic ischemic changes and global atrophy are present. No mass effect, midline shift, or acute intracranial hemorrhage. Mucous material partially fills the right sphenoid sinus. Right mastoid air cells are clear.  Minimal fluid in the left mastoid air cells.  Cranium is intact.  IMPRESSION: Other than fluid in the left mastoid air cells  and inflammatory changes in the sphenoid sinus, there is no acute intracranial pathology.  Chronic ischemic changes and atrophy are noted.  Original Report Authenticated By: Donavan Burnet, M.D.   Dg Chest Port 1 View  02/08/2011  *RADIOLOGY REPORT*  Clinical Data: Larey Seat status changes.  Shortness of breath.  PORTABLE CHEST - 1 VIEW  Comparison: None  Findings: The cardiac silhouette, mediastinal and hilar contours are within normal limits for age.  Mild tortuosity and calcification of the thoracic aorta.  The lungs are clear acute process.  No pleural effusions or pneumothorax.  The bony thorax is intact.  IMPRESSION: No acute cardiopulmonary findings.  Original Report Authenticated By: P. Loralie Champagne, M.D.    Medications: Scheduled Meds:     . B-complex with vitamin C  1 tablet Oral Daily  . cefTRIAXone (ROCEPHIN) IV  1 g Intravenous Q24H  . cholecalciferol  2,000 Units Oral BID  . citalopram  20 mg Oral Daily  . docusate sodium  100 mg Oral QHS  . enoxaparin  40 mg Subcutaneous Q24H  . ferrous sulfate  325 mg Oral Q breakfast  . lidocaine  1 patch Transdermal QHS  . metoprolol  25 mg Oral BID  . pantoprazole  40 mg Oral Daily  . senna  2 tablet Oral Daily  . traZODone  50 mg Oral QHS  . vitamin C  500 mg Oral Daily  . DISCONTD: Vitamin D2  1 tablet Oral BID   Continuous Infusions:   . sodium chloride 100 mL/hr at 02/09/11 1819   PRN Meds:.acetaminophen, bisacodyl, cloNIDine, magnesium hydroxide  Assessment/Plan:  Principal Problem:  *Encephalopathy acute Active Problems:  UTI (urinary tract infection)  Dehydration  Dysphagia  HTN (hypertension)  Plan:  Mental status seems to be improving, approaching baseline. Volume status seems to be improved as well.  Creatinine down. Will continue antibiotics and fluids.  Follow up urine culture. Can likely switch to po antibiotics by tomorrow and plan to d/c back to SNF tomorrow   LOS: 1 day   Khaliya Golinski 02/09/2011, 6:45 PM

## 2011-02-09 NOTE — Progress Notes (Signed)
CSW spoke with patient's son, Nicholas Baxter (cell#: [phone]) re: discharge plans. Patient was admitted from [facility] where he plans to return when ready for discharge.   See shadow chart for CSW Assessment Note.   Unice Bailey, LCSWA [phone]

## 2011-02-09 NOTE — Progress Notes (Signed)
Occupational Therapy Evaluation Patient Details Name: Nicholas Baxter MRN: [id number] DOB: 05/13/1918 Today's Date: 11/27/20121055 1119 ev2  Problem List:  Patient Active Problem List  Diagnoses  . Encephalopathy acute  . UTI (urinary tract infection)  . Dehydration  . History of stroke  . Dysphagia  . HTN (hypertension)    Past Medical History:  Past Medical History  Diagnosis Date  . Stroke   . Confusion   . Dysphagia    Past Surgical History: History reviewed. No pertinent past surgical history.  OT Assessment/Plan/Recommendation OT Assessment Clinical Impression Statement: pt is a 77 year old man admitted for encephalopathy with recent dx of CVA approximately 6 weeks ago.  He presents with extensor tone and deficits listed below which interferes with function.  He was recently at [facility] and would benefit from OT in acute to contiinue to work on posture and ADLs to decrease burden of care at next venue and improve quality of life OT Recommendation/Assessment: Patient will need skilled OT in the acute care venue OT Problem List: Decreased strength;Decreased activity tolerance;Impaired balance (sitting and/or standing);Decreased coordination;Decreased safety awareness;Impaired tone;Impaired UE functional use OT Therapy Diagnosis : Generalized weakness OT Plan OT Frequency: Min 2X/week OT Treatment/Interventions: Self-care/ADL training;Therapeutic exercise;Neuromuscular education;Therapeutic activities;Patient/family education;Balance training OT Recommendation Recommendations for Other Services: Speech consult Follow Up Recommendations: Skilled nursing facility Equipment Recommended: Defer to next venue Individuals Consulted Consulted and Agree with Results and Recommendations: Patient OT Goals Acute Rehab OT Goals OT Goal Formulation: With patient:  Pt is aphasic but nodded when I told him what I wanted to do Time For Goal Achievement: 2 weeks ADL Goals Pt Will  Perform Grooming: with set-up;Unsupported;Sitting, edge of bed;Other (comment) (x 3 tasks including set up ) ADL Goal: Grooming - Progress: Progressing toward goals Pt Will Perform Upper Body Bathing: with set-up;with supervision;Unsupported;Sitting, edge of bed ADL Goal: Upper Body Bathing - Progress: Progressing toward goals Pt Will Perform Lower Body Bathing: with mod assist;Sit to stand from bed (total A x 2 Pt 50% for standing) ADL Goal: Lower Body Bathing - Progress: Progressing toward goals Pt Will Perform Upper Body Dressing: with supervision;Unsupported;Sitting, bed ADL Goal: Upper Body Dressing - Progress: Progressing toward goals Pt Will Transfer to Toilet: 3-in-1;Stand pivot transfer;Other (comment);with 2+ total assist (pt 50%) ADL Goal: Toilet Transfer - Progress: Progressing toward goals Arm Goals Additional Arm Goal #1: pt will use RUE without cues during Bil activities Arm Goal: Additional Goal #1 - Progress: Progressing toward goals Miscellaneous OT Goals Miscellaneous OT Goal #1: Pt will use call bell to request help for needs OT Goal: Miscellaneous Goal #1 - Progress: Progressing toward goals  OT Evaluation Precautions/Restrictions  Precautions Precautions: Fall Restrictions Weight Bearing Restrictions: No Prior Functioning Home Living Lives With: Other (Comment) (was at [facility] SNF--CVA 6 weeks ago) Prior Function Level of Independence: Other (comment) (Assist unknown:  CVA 6 weeks ago) ADL ADL Eating/Feeding: Simulated;Set up;Supervision/safety Where Assessed - Eating/Feeding: Bed level Grooming: Performed;Wash/dry face;Supervision/safety Where Assessed - Grooming: Sitting, bed;Unsupported (supervision sitting eob) Upper Body Bathing: Simulated;Minimal assistance Where Assessed - Upper Body Bathing: Sitting, bed;Unsupported Lower Body Bathing: Simulated;+2 Total assistance;Comment for patient % (25% bathing; needs A x 2 to maintain standing-ext  posture) Where Assessed - Lower Body Bathing: Sit to stand from chair Upper Body Dressing: Simulated;Minimal assistance Where Assessed - Upper Body Dressing: Unsupported;Sitting, bed Lower Body Dressing: Simulated;+2 Total assistance;Comment for patient % (0% for LB dressing) Where Assessed - Lower Body Dressing: Sitting, bed Toilet Transfer: Other (comment) (  need A x 2:  not tested today) Toileting - Clothing Manipulation: Simulated;+1 Total assistance;Comment for patient % (0) Where Assessed - Toileting Clothing Manipulation: Standing Toileting - Hygiene: +2 Total assistance;Comment for patient % (0) Where Assessed - Toileting Hygiene: Standing ADL Comments: able to stand with A x 1 but needs 2 people for any ADL activities secondary to extensor posture.  Pt very motivated Vision/Perception  Vision - History Baseline Vision: Other (comment) Praxis Praxis: Intact Cognition Cognition Arousal/Alertness: Awake/alert Overall Cognitive Status: Impaired Safety/Judgement: Decreased awareness of safety precautions (bed alarm in use; pt restless; wanted to sit up) Decreased Safety/Judgement: Decreased awareness of need for assistance Cognition - Other Comments: follows commands Sensation/Coordination   Extremity Assessment RUE Assessment RUE Assessment: Within Functional Limits (contractures RUE 4&5; difficulty releasing grip) LUE Assessment LUE Assessment: Within Functional Limits Mobility  Bed Mobility Bed Mobility: Yes Left Sidelying to Sit: 3: Mod assist Sit to Supine - Left: 3: Mod assist Transfers Transfers: Yes Sit to Stand: 2: Max assist;Other (comment) (pt with extensor tone; leaned against bed for support) Exercises Other Exercises Other Exercises: p (pt used LUE to wash face; did incorporate RUE into bed mob) End of Session OT - End of Session Equipment Utilized During Treatment: Gait belt Activity Tolerance: Patient tolerated treatment well Patient left: in bed;with  call bell in reach Nurse Communication: Other (comment) (did not get up to chair (needs Ax2 and chair alarm)) General Behavior During Session: [facility] for tasks performed Cognition: [facility] for tasks performed   Diamante Truszkowski [phone] 02/09/2011, 11:55 AM

## 2011-02-09 NOTE — Progress Notes (Signed)
Physical Therapy Evaluation Patient Details Name: Nicholas Baxter MRN: [id number] DOB: 07-Dec-1918 Today's Date: 02/09/2011 13:11-13:35 EVII  Recommend continued PT trial at SNF to attempt to increase balance and mobility to decrease burden of care   Problem List:  Patient Active Problem List  Diagnoses  . Encephalopathy acute  . UTI (urinary tract infection)  . Dehydration  . History of stroke  . Dysphagia  . HTN (hypertension)    Past Medical History:  Past Medical History  Diagnosis Date  . Stroke   . Confusion   . Dysphagia    Past Surgical History: History reviewed. No pertinent past surgical history.  PT Assessment/Plan/Recommendation PT Assessment Clinical Impression Statement: Pt with spastic hemiplegia and communication deficits impairing mobility.  He would benefirt from tiral of PT at SNF to improve mobility to decrease burden of care PT Recommendation/Assessment: Patient will need skilled PT in the acute care venue PT Problem List: Decreased coordination;Decreased strength;Decreased range of motion;Decreased activity tolerance;Decreased balance;Decreased mobility;Decreased safety awareness;Decreased knowledge of use of DME;Impaired tone PT Therapy Diagnosis : Difficulty walking;Generalized weakness;Hemiplegia dominant side PT Plan PT Frequency: Min 2X/week PT Treatment/Interventions: Functional mobility training;Therapeutic exercise;Balance training;Neuromuscular re-education;Patient/family education;DME instruction PT Recommendation Follow Up Recommendations: Skilled nursing facility Equipment Recommended: Defer to next venue PT Goals  Acute Rehab PT Goals PT Goal Formulation: Patient unable to participate in goal setting Time For Goal Achievement: 2 weeks Pt will go Supine/Side to Sit: with min assist PT Goal: Supine/Side to Sit - Progress: Not met Pt will Sit at [facility] of Bed: 3-5 min;with supervision PT Goal: Sit at [facility] Of Bed - Progress: Not met Pt will go  Sit to Supine/Side: with min assist PT Goal: Sit to Supine/Side - Progress: Not met Pt will Transfer Sit to Stand/Stand to Sit: with min assist PT Transfer Goal: Sit to Stand/Stand to Sit - Progress: Not met  PT Evaluation Precautions/Restrictions  Precautions Precautions: Fall Prior Functioning  Home Living Lives With: Other (Comment) (was at [facility] SNF--CVA 6 weeks ago) Type of Home: Skilled Nursing Facility Prior Function Level of Independence: Other (comment) (Assist unknown:  CVA 6 weeks ago) Cognition Cognition Arousal/Alertness: Awake/alert Overall Cognitive Status: Impaired Safety/Judgement: Decreased awareness of safety precautions (bed alarm in place) Decreased Safety/Judgement: Impulsive;Decreased awareness of need for assistance Safety/Judgement - Other Comments: pt unable to assist self when sliding towarad edge of bed, resisited attempts to correct Cognition - Other Comments: follows commands Sensation/Coordination   Extremity Assessment RUE Assessment RUE Assessment: Within Functional Limits (contractures RUE 4&5; difficulty releasing grip) LUE Assessment LUE Assessment: Within Functional Limits RLE Assessment RLE Assessment: Exceptions to [facility] RLE AROM (degrees) Overall AROM Right Lower Extremity: Deficits;Unable to assess RLE Overall AROM Comments: some random motion observed but unable to assess because of increased extensor tone RLE PROM (degrees) Overall PROM Right Lower Extremity: Unable to assess RLE Overall PROM Comments: extensor tone too great to allow for PROM RLE Tone RLE Tone: Severe;Hypertonic Hypertonic Details: hip and knee in extension with ankle in plantarflextion RLE Tone Comments: increased tone impairs sitting balance Mobility (including Balance) Bed Mobility Bed Mobility: Yes Left Sidelying to Sit: 1: +2 Total assist Left Sidelying to Sit Details (indicate cue type and reason): pt ~ 50%.  He needed assit to stablize at EOB as he had  strong turnke extension pushing hips forward.  Unable to stabalize his feet by himself Sit to Supine - Left: 1: +2 Total assist Sit to Supine - Left Details (indicate cue type and reason): pt ~ 50%  Transfers Sit to Stand: 1: +2 Total assist;Other (comment) (manually flex right knee to stabalize foot on floor) Sit to Stand Details (indicate cue type and reason): pt with strong extensor tone in trunk and right leg. Manually positiioned leg and stabalized both feet to pull pt up to standing.  Once up, he could maintain with +2 pt = 50% for about 2 minutes.  unable to weight shift Ambulation/Gait Ambulation/Gait: No Stairs: No  Posture/Postural Control Posture/Postural Control: Postural limitations Postural Limitations: Strong extensor tone pulling him backward and right leg out in front Balance Balance Assessed: Yes Static Sitting Balance Static Sitting - Balance Support: Bilateral upper extremity supported Static Sitting - Level of Assistance: 1: +2 Total assist Static Sitting - Comment/# of Minutes: 3 minutes.  Needed to physically pull patient forward to bring towards hip flexion Static Standing Balance Static Standing - Balance Support: Bilateral upper extremity supported (+2 tot Assist, but able to bear weight through legs) Exercise  Other Exercises Other Exercises: p (pt used LUE to wash face; did incorporate RUE into bed mob) End of Session PT - End of Session Equipment Utilized During Treatment: Gait belt Activity Tolerance: Patient limited by fatigue;Treatment limited secondary to medical complications (Comment) (limited by extensor tone) Patient left: in bed Nurse Communication: Mobility status for transfers General Behavior During Session: [facility] for tasks performed Cognition: Impaired, at baseline  Donnetta Hail 02/09/2011, 2:15 PM

## 2011-02-10 LAB — BASIC METABOLIC PANEL
BUN: 15 mg/dL (ref 6–23)
Creatinine, Ser: 1.32 mg/dL (ref 0.50–1.35)
GFR calc Af Amer: 53 mL/min — ABNORMAL LOW (ref >90)
GFR calc non Af Amer: 46 mL/min — ABNORMAL LOW (ref >90)
Potassium: 3.7 mEq/L (ref 3.5–5.1)

## 2011-02-10 LAB — URINE CULTURE
Colony Count: >=100000
Culture  Setup Time: 201211270206
Special Requests: NORMAL

## 2011-02-10 LAB — CBC
HCT: 32.9 % — ABNORMAL LOW (ref 39.0–52.0)
MCHC: 32.2 g/dL (ref 30.0–36.0)
Platelets: 223 10*3/uL (ref 150–400)
RDW: 16.6 % — ABNORMAL HIGH (ref 11.5–15.5)

## 2011-02-10 MED ORDER — MORPHINE SULFATE 2 MG/ML IJ SOLN
1.0000 mg | INTRAMUSCULAR | Status: DC | PRN
  Administered 2011-02-10 – 2011-02-11 (×3): 2 mg via INTRAVENOUS
  Filled 2011-02-10 (×3): qty 1

## 2011-02-10 MED ORDER — HALOPERIDOL LACTATE 5 MG/ML IJ SOLN
2.0000 mg | Freq: Once | INTRAMUSCULAR | Status: AC
  Administered 2011-02-10: 2 mg via INTRAVENOUS
  Filled 2011-02-10: qty 1

## 2011-02-10 NOTE — Consult Note (Signed)
Date of Admission:  02/08/2011  Date of Consult:  02/10/2011  Reason for Consult  Viridans streptococci in urine Referring Physician: Dr. Arthor Captain   HPI: Nicholas Baxter is an 77 y.o. male. hypertension, depression, recent stroke approximately 6 weeks ago where he was treated at [facility] center. He had been residing in  [facility] skilled nursing facility for rehabilitation. He was brought to the emergency room today when he was found to be increasingly lethargic. He was very difficult to arouse and per ER records was noted to be hypoxic and possibly bradycardic. He was brought to the emergency room where he was provided with oxygen and IV fluids. He was also given a dose of Narcan which may have improved his symptoms. Workup in ED showed some pyuria and his urine was cultures and he was started on rocephin and given IVF. He has apparently improved, though I found him to be fairly delirous still when I examined him. He grew Viridans group Streptococci from urine. BLood cultures were not done simultaneously at admission.     Past Medical History  Diagnosis Date  . Stroke   . Confusion   . Dysphagia     History reviewed. No pertinent past surgical history.ergies:   No Known Allergies   Medications: I have reviewed patients current medications as documented in Epic Anti-infectives     Start     Dose/Rate Route Frequency Ordered Stop   02/09/11 0003   cefTRIAXone (ROCEPHIN) 1 g in dextrose 5 % 50 mL IVPB        1 g 100 mL/hr over 30 Minutes Intravenous Every 24 hours 02/09/11 0003     02/08/11 1515   cefTRIAXone (ROCEPHIN) 1 g in dextrose 5 % 50 mL IVPB        1 g 100 mL/hr over 30 Minutes Intravenous  Once 02/08/11 1509 02/08/11 1615          Social History:  reports that he has never smoked. He does not have any smokeless tobacco history on file. He reports that he does not drink alcohol or use illicit drugs.  History reviewed. No pertinent family history.  As in HPI and  primary teams notes otherwise 12 point review of systems is negative  Blood pressure 153/73, pulse 57, temperature 97.9 F (36.6 C), temperature source Oral, resp. rate 16, weight 131 lb 14.4 oz (59.829 kg), SpO2 99.00%. General: Alert delirious HEENT: anicteric sclera, pupils reactive to light and accommodation, EOMI, oropharynx clear and without exudate CVS regular rate, normal r,  no murmur rubs or gallops Chest: clear to auscultation bilaterally, no wheezing, rales or rhonchi Abdomen: soft nontender, nondistended, normal bowel sounds, Extremities: no  clubbing or edema noted bilaterally Skin: no rashes Neuro: moves all extremities, no obvious focal deficit   Results for orders placed during the hospital encounter of 02/08/11 (from the past 48 hour(s))  GLUCOSE, CAPILLARY     Status: Abnormal   Collection Time   02/09/11 12:38 AM      Component Value Range Comment   Glucose-Capillary 120 (*) 70 - 99 (mg/dL)    Comment 1 Notify RN      Comment 2 Documented in Chart     BASIC METABOLIC PANEL     Status: Abnormal   Collection Time   02/09/11  3:52 AM      Component Value Range Comment   Sodium 139  135 - 145 (mEq/L)    Potassium 3.9  3.5 - 5.1 (mEq/L)    Chloride  105  96 - 112 (mEq/L)    CO2 28  19 - 32 (mEq/L)    Glucose, Bld 99  70 - 99 (mg/dL)    BUN 19  6 - 23 (mg/dL)    Creatinine, Ser 1.61  0.50 - 1.35 (mg/dL)    Calcium 8.5  8.4 - 10.5 (mg/dL)    GFR calc non Af Amer 48 (*) >90 (mL/min)    GFR calc Af Amer 56 (*) >90 (mL/min)   CBC     Status: Abnormal   Collection Time   02/09/11  3:52 AM      Component Value Range Comment   WBC 9.3  4.0 - 10.5 (K/uL)    RBC 3.41 (*) 4.22 - 5.81 (MIL/uL)    Hemoglobin 10.9 (*) 13.0 - 17.0 (g/dL)    HCT 09.6 (*) 04.5 - 52.0 (%)    MCV 99.4  78.0 - 100.0 (fL)    MCH 32.0  26.0 - 34.0 (pg)    MCHC 32.2  30.0 - 36.0 (g/dL)    RDW 40.9 (*) 81.1 - 15.5 (%)    Platelets 236  150 - 400 (K/uL)   CBC     Status: Abnormal   Collection  Time   02/10/11  3:41 AM      Component Value Range Comment   WBC 6.1  4.0 - 10.5 (K/uL)    RBC 3.29 (*) 4.22 - 5.81 (MIL/uL)    Hemoglobin 10.6 (*) 13.0 - 17.0 (g/dL)    HCT 91.4 (*) 78.2 - 52.0 (%)    MCV 100.0  78.0 - 100.0 (fL)    MCH 32.2  26.0 - 34.0 (pg)    MCHC 32.2  30.0 - 36.0 (g/dL)    RDW 95.6 (*) 21.3 - 15.5 (%)    Platelets 223  150 - 400 (K/uL)   BASIC METABOLIC PANEL     Status: Abnormal   Collection Time   02/10/11  3:41 AM      Component Value Range Comment   Sodium 139  135 - 145 (mEq/L)    Potassium 3.7  3.5 - 5.1 (mEq/L)    Chloride 103  96 - 112 (mEq/L)    CO2 27  19 - 32 (mEq/L)    Glucose, Bld 90  70 - 99 (mg/dL)    BUN 15  6 - 23 (mg/dL)    Creatinine, Ser 0.86  0.50 - 1.35 (mg/dL)    Calcium 8.6  8.4 - 10.5 (mg/dL)    GFR calc non Af Amer 46 (*) >90 (mL/min)    GFR calc Af Amer 53 (*) >90 (mL/min)       Component Value Date/Time   SDES URINE, CATHETERIZED 02/08/2011 1318   SPECREQUEST rocephin Normal 02/08/2011 1318   CULT VIRIDANS STREPTOCOCCUS 02/08/2011 1318   REPTSTATUS 02/10/2011 FINAL 02/08/2011 1318   No results found.   Recent Results (from the past 720 hour(s))  URINE CULTURE     Status: Normal   Collection Time   01/25/11  4:04 PM      Component Value Range Status Comment   Specimen Description URINE, CATHETERIZED   Final    Special Requests NONE   Final    Setup Time [id number]   Final    Colony Count NO GROWTH   Final    Culture NO GROWTH   Final    Report Status 01/26/2011 FINAL   Final   URINE CULTURE     Status: Normal   Collection Time  02/08/11  1:18 PM      Component Value Range Status Comment   Specimen Description URINE, CATHETERIZED   Final    Special Requests rocephin Normal   Final    Setup Time [id number]   Final    Colony Count >=100,000 COLONIES/ML   Final    Culture VIRIDANS STREPTOCOCCUS   Final    Report Status 02/10/2011 FINAL   Final      Impression/Recommendation 77 year old man with HTN,  recent CVA, ? Undiagnosed dementia admitted with confusion that improved with narcan, also found to have viridans in urine.  Viridans in urine: Certainly not at all a common uTI pathogen. It is certainly found with bacterial endocarditis and other odontogenic infections. It is also a frequent contaminant of cultures capable of being inoculated into culture media by breathing of technician, phlebotomist etc or via improper technique of collecting cultures.  --I think getting a 2 d echo is reasonable --If unremarkable I would not pursue further with TEE unless something changes clinicallyh and I wouldn not rx for endocarditis empirically, I would continue him on rocephin for now and finish 5 day course of antibiotics total, last two could be given with amoxicillin if desired    Thank you so much for this interesting consult,   Acey Lav 02/10/2011, 5:23 PM   [phone] (pager) [phone] (office)

## 2011-02-10 NOTE — Progress Notes (Signed)
Information requested for pt's [facility] admission (2D echo, H&P, D/C summary) was rec'd late this afternoon. Info in pts "ghost chart" in [place] outside his room.

## 2011-02-10 NOTE — Progress Notes (Signed)
DAILY PROGRESS NOTE                              GENERAL INTERNAL MEDICINE [facility]  SUBJECTIVE:  Patient is bed probably back to baseline his mental status, patient have dysarthria.  OBJECTIVE: BP 126/68  Pulse 57  Temp(Src) 97.9 F (36.6 C) (Oral)  Resp 16  Wt 59.829 kg (131 lb 14.4 oz)  SpO2 99%  Intake/Output Summary (Last 24 hours) at 02/10/11 0920 Last data filed at 02/10/11 0600  Gross per 24 hour  Intake   1668 ml  Output   1525 ml  Net    143 ml                      Weight change:  Physical Exam: General: Alert and awake oriented x3 not in any acute distress. HEENT: anicteric sclera, pupils equal reactive to light and accommodation CVS: S1-S2 heard, no murmur rubs or gallops Chest: clear to auscultation bilaterally, no wheezing rales or rhonchi Abdomen:  normal bowel sounds, soft, nontender, nondistended, no organomegaly Neuro: Cranial nerves II-XII intact, no focal neurological deficits Extremities: no cyanosis, no clubbing or edema noted bilaterally   Lab Results:  Basename 02/10/11 0341 02/09/11 0352  NA 139 139  K 3.7 3.9  CL 103 105  CO2 27 28  GLUCOSE 90 99  BUN 15 19  CREATININE 1.32 1.27  CALCIUM 8.6 8.5  MG -- --  PHOS -- --    Basename 02/08/11 1332  AST 15  ALT 12  ALKPHOS 85  BILITOT 0.4  PROT 7.0  ALBUMIN 3.4*   No results found for this basename: LIPASE:2,AMYLASE:2 in the last 72 hours  Basename 02/10/11 0341 02/09/11 0352 02/08/11 1332  WBC 6.1 9.3 --  NEUTROABS -- -- 4.7  HGB 10.6* 10.9* --  HCT 32.9* 33.9* --  MCV 100.0 99.4 --  PLT 223 236 --   Micro Results: Recent Results (from the past 240 hour(s))  URINE CULTURE     Status: Normal   Collection Time   02/08/11  1:18 PM      Component Value Range Status Comment   Specimen Description URINE, CATHETERIZED   Final    Special Requests rocephin Normal   Final    Setup Time [id number]   Final    Colony Count >=100,000 COLONIES/ML   Final    Culture VIRIDANS  STREPTOCOCCUS   Final    Report Status 02/10/2011 FINAL   Final     Studies/Results: Ct Head Wo Contrast  02/08/2011  *RADIOLOGY REPORT*  Clinical Data: Altered mental status  CT HEAD WITHOUT CONTRAST  Technique:  Contiguous axial images were obtained from the base of the skull through the vertex without contrast.  Comparison: None.  Findings: Chronic ischemic changes and global atrophy are present. No mass effect, midline shift, or acute intracranial hemorrhage. Mucous material partially fills the right sphenoid sinus. Right mastoid air cells are clear.  Minimal fluid in the left mastoid air cells.  Cranium is intact.  IMPRESSION: Other than fluid in the left mastoid air cells and inflammatory changes in the sphenoid sinus, there is no acute intracranial pathology.  Chronic ischemic changes and atrophy are noted.  Original Report Authenticated By: Donavan Burnet, M.D.   Dg Chest Port 1 View  02/08/2011  *RADIOLOGY REPORT*  Clinical Data: Larey Seat status changes.  Shortness of breath.  PORTABLE CHEST - 1 VIEW  Comparison: None  Findings: The cardiac silhouette, mediastinal and hilar contours are within normal limits for age.  Mild tortuosity and calcification of the thoracic aorta.  The lungs are clear acute process.  No pleural effusions or pneumothorax.  The bony thorax is intact.  IMPRESSION: No acute cardiopulmonary findings.  Original Report Authenticated By: P. Loralie Champagne, M.D.   Dg Swallowing Func-no Report  02/03/2011  CLINICAL DATA: CVA; aspiration risk   FLUOROSCOPY FOR SWALLOWING FUNCTION STUDY:  Fluoroscopy was provided for swallowing function study, which was  administered by a speech pathologist.  Final results and recommendations  from this study are contained within the speech pathology report.     Medications: Scheduled Meds:   . B-complex with vitamin C  1 tablet Oral Daily  . cefTRIAXone (ROCEPHIN)  IV  1 g Intravenous Q24H  . cholecalciferol  2,000 Units Oral BID  .  citalopram  20 mg Oral Daily  . docusate sodium  100 mg Oral QHS  . enoxaparin  40 mg Subcutaneous Q24H  . ferrous sulfate  325 mg Oral Q breakfast  . lidocaine  1 patch Transdermal QHS  . metoprolol  25 mg Oral BID  . pantoprazole  40 mg Oral Daily  . senna  2 tablet Oral Daily  . traZODone  50 mg Oral QHS  . vitamin C  500 mg Oral Daily   Continuous Infusions:   . sodium chloride 100 mL/hr at 02/10/11 0600   PRN Meds:.acetaminophen, bisacodyl, cloNIDine, food thickener, magnesium hydroxide, DISCONTD: food thickener  ASSESSMENT & PLAN: Principal Problem:  *Encephalopathy acute Active Problems:  UTI (urinary tract infection)  Dehydration  Dysphagia  HTN (hypertension)  1. Acute encephalopathy: This is likely secondary to UTI and dehydration. This is resolved patient back to his baseline status after IV fluid hydration antibiotics being started.  2. UTI: Patient to you urinalysis was consistent with UTI, urine culture showed Streptococcus viridans. Patient has a significant colony count of more than 100,000 colonies. I will obtain blood cultures x2 and consult ID for a comanagement.  3. Dysphagia: Secondary to recent CVA and the patient also have dysarthria.  4. CVA: This is been treated recently in [facility]. I will get the echocardiogram done for the CVA over there.       LOS: 2 days   Jacobi Ryant A 02/10/2011, 9:20 AM

## 2011-02-11 ENCOUNTER — Encounter (HOSPITAL_COMMUNITY): Payer: Self-pay

## 2011-02-11 DIAGNOSIS — I319 Disease of pericardium, unspecified: Secondary | ICD-10-CM

## 2011-02-11 NOTE — Progress Notes (Signed)
DAILY PROGRESS NOTE                              GENERAL [facility]IC[facility]  SUBJECTIVE:  Patient does have dysarthria, he is confused, he is probably back to his baseline of his mental status.  OBJECTIVE: BP 176/90  Pulse 65  Temp(Src) 97.8 F (36.6 C) (Oral)  Resp 16  Wt 59.829 kg (131 lb 14.4 oz)  SpO2 85%  Intake/Output Summary (Last 24 hours) at 02/11/11 1252 Last data filed at 02/11/11 0915  Gross per 24 hour  Intake   1168 ml  Output   1365 ml  Net   -197 ml                      Weight change:  Physical Exam: General: Alert and awake and confused, not in any acute distress. HEENT: anicteric sclera, pupils equal reactive to light and accommodation CVS: S1-S2 heard, no murmur rubs or gallops Chest: clear to auscultation bilaterally, no wheezing rales or rhonchi Abdomen:  normal bowel sounds, soft, nontender, nondistended, no organomegaly Neuro: Cranial nerves II-XII intact, no focal neurological deficits Extremities: no cyanosis, no clubbing or edema noted bilaterally   Lab Results:  [facility] 02/10/11 0341 02/09/11 0352  NA 139 139  K 3.7 3.9  CL 103 105  CO2 27 28  GLUCOSE 90 99  BUN 15 19  CREATININE 1.32 1.27  CALCIUM 8.6 8.5  MG -- --  PHOS -- --    Basename 02/08/11 1332  AST 15  ALT 12  ALKPHOS 85  BILITOT 0.4  PROT 7.0  ALBUMIN 3.4*   No results found for this basename: LIPASE:2,AMYLASE:2 in the last 72 hours  Basename 02/10/11 0341 02/09/11 0352 02/08/11 1332  WBC 6.1 9.3 --  NEUTROABS -- -- 4.7  HGB 10.6* 10.9* --  HCT 32.9* 33.9* --  MCV 100.0 99.4 --  PLT 223 236 --   Micro Results: Recent Results (from the past 240 hour(s))  URINE CULTURE     Status: Normal   Collection Time   02/08/11  1:18 PM      Component Value Range Status Comment   Specimen Description URINE, CATHETERIZED   Final    Special Requests rocephin Normal   Final    Setup Time [id number]   Final    Colony Count >=100,000 COLONIES/ML   Final     Culture VIRIDANS STREPTOCOCCUS   Final    Report Status 02/10/2011 FINAL   Final   CULTURE, BLOOD (ROUTINE X 2)     Status: Normal (Preliminary result)   Collection Time   02/10/11  9:55 AM      Component Value Range Status Comment   Specimen Description BLOOD RIGHT ARM   Final    Special Requests BOTTLES DRAWN AEROBIC AND ANAEROBIC 5CC   Final    Setup Time [id number]   Final    Culture     Final    Value:        BLOOD CULTURE RECEIVED NO GROWTH TO DATE CULTURE WILL BE HELD FOR 5 DAYS BEFORE ISSUING A FINAL NEGATIVE REPORT   Report Status PENDING   Incomplete   CULTURE, BLOOD (ROUTINE X 2)     Status: Normal (Preliminary result)   Collection Time   02/10/11  9:55 AM      Component Value Range Status Comment   Specimen Description BLOOD LEFT HAND  Final    Special Requests BOTTLES DRAWN AEROBIC ONLY 1CC   Final    Setup Time [id number]   Final    Culture     Final    Value:        BLOOD CULTURE RECEIVED NO GROWTH TO DATE CULTURE WILL BE HELD FOR 5 DAYS BEFORE ISSUING A FINAL NEGATIVE REPORT   Report Status PENDING   Incomplete     Studies/Results: Ct Head Wo Contrast  02/08/2011  *RADIOLOGY REPORT*  Clinical Data: Altered mental status  CT HEAD WITHOUT CONTRAST  Technique:  Contiguous axial images were obtained from the base of the skull through the vertex without contrast.  Comparison: None.  Findings: Chronic ischemic changes and global atrophy are present. No mass effect, midline shift, or acute intracranial hemorrhage. Mucous material partially fills the right sphenoid sinus. Right mastoid air cells are clear.  Minimal fluid in the left mastoid air cells.  Cranium is intact.  IMPRESSION: Other than fluid in the left mastoid air cells and inflammatory changes in the sphenoid sinus, there is no acute intracranial pathology.  Chronic ischemic changes and atrophy are noted.  Original Report Authenticated By: Donavan Burnet, M.D.   Dg Chest Port 1 View  02/08/2011  *RADIOLOGY  REPORT*  Clinical Data: Larey Seat status changes.  Shortness of breath.  PORTABLE CHEST - 1 VIEW  Comparison: None  Findings: The cardiac silhouette, mediastinal and hilar contours are within normal limits for age.  Mild tortuosity and calcification of the thoracic aorta.  The lungs are clear acute process.  No pleural effusions or pneumothorax.  The bony thorax is intact.  IMPRESSION: No acute cardiopulmonary findings.  Original Report Authenticated By: P. Loralie Champagne, M.D.   Dg Swallowing Func-no Report  02/03/2011  CLINICAL DATA: CVA; aspiration risk   FLUOROSCOPY FOR SWALLOWING FUNCTION STUDY:  Fluoroscopy was provided for swallowing function study, which was  administered by a speech pathologist.  Final results and recommendations  from this study are contained within the speech pathology report.     Medications: Scheduled Meds:    . B-complex with vitamin C  1 tablet Oral Daily  . cefTRIAXone (ROCEPHIN)  IV  1 g Intravenous Q24H  . cholecalciferol  2,000 Units Oral BID  . citalopram  20 mg Oral Daily  . docusate sodium  100 mg Oral QHS  . enoxaparin  40 mg Subcutaneous Q24H  . ferrous sulfate  325 mg Oral Q breakfast  . haloperidol lactate  2 mg Intravenous Once  . lidocaine  1 patch Transdermal QHS  . metoprolol  25 mg Oral BID  . pantoprazole  40 mg Oral Daily  . senna  2 tablet Oral Daily  . traZODone  50 mg Oral QHS  . vitamin C  500 mg Oral Daily   Continuous Infusions:    . sodium chloride 100 mL/hr at 02/10/11 1555   PRN Meds:.acetaminophen, bisacodyl, cloNIDine, food thickener, magnesium hydroxide, morphine injection  ASSESSMENT & PLAN: Principal Problem:  *Encephalopathy acute Active Problems:  UTI (urinary tract infection)  Dehydration  Dysphagia  HTN (hypertension)  1. Acute encephalopathy: This is likely secondary to UTI and dehydration versus narcotic medications. This is resolved patient back to his baseline status after IV fluid hydration antibiotics being  started.  2. UTI: Patient to you urinalysis was consistent with UTI, urine culture showed Streptococcus viridans. Patient has a significant colony count of more than 100,000 colonies. ID called they recommended 2-D echocardiogram and blood culture. Continue the  Rocephin for now and the and switched to amoxicillin at the time of discharge. Wall hold patient in the hospital here till the morning. Probable discharge in the morning to skilled nursing facility as the 2-D echocardiogram is pending for now.  3. Dysphagia: Secondary to recent CVA and the patient also have dysarthria.  4. CVA: This is been treated recently in [facility]. I will get the echocardiogram done for the CVA over there.   5. Disposition: Patient is a nursing home resident at [facility] nursing home. I spoke with his son Raheen Capili been explained to him the the problem was I dysphagia and dementia. Family is not interested now in the doing PEG tube, I recommended for palliative medicine team consult and he agreed will call them for comanagement.      LOS: 3 days   Aliviana Burdell A 02/11/2011, 12:52 PM

## 2011-02-11 NOTE — Progress Notes (Signed)
Palliative Medicine Team consult for goals of care requested by Dr Arthor Captain spoke with pt's dil Amil Amen she and husband/pt's son Leonette Most need to travel to [facility] tomorrow to follow up on insurance paperwork request meeting time Saturday 02/13/11 @ 10:00 am   Valente David, RN 02/11/2011  2:15 PM Palliative Medicine Team RN Liaison [phone]

## 2011-02-11 NOTE — Progress Notes (Signed)
*  PRELIMINARY RESULTS* Echocardiogram 2D Echocardiogram has been performed.  Glean Salen [facility] 02/11/2011, 12:22 PM

## 2011-02-12 MED ORDER — AMOXICILLIN 400 MG/5ML PO SUSR: 400.0000 mg | Freq: Three times a day (TID) | ORAL | Status: AC

## 2011-02-12 NOTE — Progress Notes (Signed)
Call received from Jolene Provost [phone]) pt's dil stating she was called and told pt may be discharged back to [facility] today; as PMT meeting was scheduled for tomorrow she wanted to know how to proceed. Discussed with Amil Amen that as doctor determines that patient is stable for d/c back to SNF she can request an order from the doctor for a goals of care discussion follow up with Palliative Care Services once he is back at the facility and the facility staff,as well as the family, can  contact Palliative Care Services with [facility] @ [phone] who will schedule a time to meet. Informed Amil Amen that Dr Arthor Captain in his discharge instructions will also recommend a Palliative Care Service consult on he is back at the facility and that our team will also alert [facility] to follow up next week Amil Amen voiced understanding and was agreeable to this. PMT meeting for tomorrow cancelled. This RN also communicated above conversation with pt's staff RN, Shanda Bumps, Dr Arthor Captain and CSW [place] @ [phone].  Valente David, RN Palliative Medicine Team RN Liaison [phone]

## 2011-02-12 NOTE — Discharge Summary (Signed)
HOSPITAL DISCHARGE SUMMARY  Nicholas Baxter  MRN: [id number]  DOB:26-Nov-1918  Date of Admission: 02/08/2011 Date of Discharge: 02/12/2011         LOS: 4 days   Attending Physician:Aaliyana Fredericks A  Consults: Treatment Team:  Acey Lav, MD [facility]  Discharge Diagnoses: Present on Admission:  .Encephalopathy acute .UTI (urinary tract infection) .Dehydration .Dysphagia .HTN (hypertension)   Current Discharge Medication List    START taking these medications   Details  amoxicillin (AMOXIL) 400 MG/5ML suspension Take 5 mLs (400 mg total) by mouth 3 (three) times daily. Qty: 100 mL, Refills: 0      CONTINUE these medications which have NOT CHANGED   Details  acetaminophen (TYLENOL) 325 MG tablet Take 650 mg by mouth every 6 (six) hours as needed. pain    B Complex-C (B-COMPLEX WITH VITAMIN C) tablet Take 1 tablet by mouth daily.     bisacodyl (DULCOLAX) 10 MG suppository Place 10 mg rectally as needed. For constipation. Give only if no relief from Milk of Magnesia.    citalopram (CELEXA) 20 MG tablet Take 20 mg by mouth daily.      docusate sodium (COLACE) 100 MG capsule Take 100 mg by mouth at bedtime.      Ergocalciferol (VITAMIN D2) 2000 UNITS TABS Take 1 tablet by mouth 2 (two) times daily.     ferrous sulfate 325 (65 FE) MG tablet Take 325 mg by mouth daily with breakfast.      HYDROcodone-acetaminophen (NORCO) 5-325 MG per tablet Take 1 tablet by mouth every 6 (six) hours as needed. pain    lidocaine (LIDODERM) 5 % Place 1 patch onto the skin every 12 (twelve) hours. Remove & Discard patch within 12 hours or as directed by MD    magnesium hydroxide (MILK OF MAGNESIA) 400 MG/5ML suspension Take 30 mLs by mouth daily as needed. constipation    Melatonin 3 MG TABS Take 1 tablet by mouth daily.     metoprolol (LOPRESSOR) 50 MG tablet Take 25 mg by mouth 2 (two) times daily.     pantoprazole (PROTONIX) 40 MG tablet Take 40 mg by mouth daily.        psyllium (REGULOID) 0.52 G capsule Take 0.52 g by mouth at bedtime.     senna (SENOKOT) 8.6 MG TABS Take 2 tablets by mouth daily.     traZODone (DESYREL) 50 MG tablet Take 50 mg by mouth at bedtime.     vitamin C (ASCORBIC ACID) 500 MG tablet Take 500 mg by mouth daily.      Sodium Phosphates (FLEET ENEMA RE) Place 1 applicator rectally once as needed. For constipation. Not relieved by Milk of Magnesia or Bisacodyl suppository        Brief Admission History: This is a 77 year old gentleman with history of hypertension, depression, recent stroke approximately 6 weeks ago where he was treated at [facility] medical center. Patient is currently residing at [facility] skilled nursing facility for rehabilitation. He was brought to the emergency room today when he was found to be increasingly lethargic. He was very difficult to arouse and per ER records was noted to be hypoxic and possibly bradycardic. He was brought to the emergency room where he was provided with oxygen and IV fluids. He was also given a dose of Narcan which may have improved his symptoms as well. Further evaluation showed that the patient had a urinalysis which suggested a urinary tract infection. He also had elevated BUN creatinine and clinically appeared dehydrated.  The  patient is confused and is unable to provide any history. After speaking to the patient's son over the phone it was reported that patient has had significant aphasia since his stroke. He speech is difficult to comprehend at baseline. He also ambulates minimally and spends most of his time in a chair. His son reports that normally he is alert and aware of his surroundings does have difficulty communicating due to his neurologic deficits. The patient has been referred for further evaluation treatment  Hospital Course: Present on Admission:  .Encephalopathy acute .UTI (urinary tract infection) .Dehydration .Dysphagia .HTN (hypertension):  1. Streptococcus viridans  UTI: After patient admitted to the hospital his UA was suggesting UTI patient was started on Rocephin. In the urine culture grew a Streptococcus viridans. Because it's very unusual for this organism to cause UTI, I called infectious diseases specialist Dr. Daiva Eves. He recommended to obtained blood cultures x2 and the 2 echocardiogram to rule out valvular vegetations. The echocardiogram was negative, and the blood culture is no growth to date culture of discharge. Patient remained on ceftriaxone while in the hospital at the time of discharge was put on amoxicillin for 6 more days.  2. Acute encephalopathy: This is probably multifactorial, secondary to a he's UTI and his medications. It's been reported in the emergency department he did respond slightly to Narcan. Even though I do not think that his medication is very involved and as a encephalopathy. The patient did have UTI and dehydration which can cause encephalopathy.  3. Dysphagia: Patient did have a CVA recently and he was hospitalized in [facility] for it. Patient is supposed to be on dysphagia 1 with honey thick liquids diet. And to feed him only when he is fully awake with chin tuck techniques to maximize the airway protection.  4. Recent CVA continue PT/OT.  5. Advanced directives: Patient is DNR/DNI. Patient has very poor prognosis with dementia and dysphagia. I spoke with family and patient was supposed to be seen by palliative medicine team. But because of the pending discharge to the SNF patient will be followed as outpatient in the skilled nursing facility by palliative care to determine the goals of care.  Day of Discharge BP 178/90  Pulse 65  Temp(Src) 97.6 F (36.4 C) (Oral)  Resp 16  Ht 5\' 8"  (1.727 m)  Wt 59.829 kg (131 lb 14.4 oz)  BMI 20.06 kg/m2  SpO2 97% Physical Exam: GEN: No acute distress, cooperative with exam PSYCH: He is alert and oriented x4; does not appear anxious does not appear depressed; affect is  normal  HEENT: Mucous membranes pink and anicteric;  Mouth: without oral thrush or lesions Eyes: PERRLA; EOM intact;  Neck: no cervical lymphadenopathy nor thyromegaly or carotid bruit; no JVD;  CHEST WALL: No tenderness, symmetrical to breathing bilaterally CHEST: Normal respiration, clear to auscultation bilaterally  HEART: Regular rate and rhythm; no murmurs, rubs or gallops, S1 and S2 heard  BACK: No kyphosis or scoliosis; no CVA tenderness  ABDOMEN:  soft non-tender; no masses, no organomegaly, normal abdominal bowel sounds; no pannus; no intertriginous candida.  EXTREMITIES: No bone or joint deformity; no edema; no ulcerations.  PULSES: 2+ and symmetric, neurovascularity is intact SKIN: Normal hydration no rash or ulceration, no flushing or suspicious lesions  CNS: Cranial nerves 2-12 grossly intact no focal neurologic deficit, coordination is intact gait not tested    No results found for this or any previous visit (from the past 24 hour(s)).  Disposition: SNF   Follow-up  Appts: Discharge Orders    Future Orders Please Complete By Expires   Diet - low sodium heart healthy      Comments:   Dysphagia 1 diet with honey thick liquids   Increase activity slowly         Special instructions: Recommend palliative consult to determine the goals of care. Diet: Dysphagia 1 diet with honey thick liquids, heart healthy   Signed: Temari Schooler A 02/12/2011, 1:00 PM

## 2011-02-12 NOTE — Progress Notes (Signed)
PT deferred today d/t pt with elevated BP (178/90) this am, palliative care consult pending (to be completed Sat).  Will check on pt as schedule permits, likely Monday.   Pt currently on caseload for  Trial of PT at a frequency of 2x/wk.

## 2011-02-12 NOTE — Progress Notes (Signed)
Initial visit with pt on referral from staff chaplain, Jon Gills.  No family present.  Pt was receptive to chaplain presence, though still not able communicate.  Pt able to answer yes and no questions. Chaplain provided support.  Pt is fan of classical music and was excited about the possibility of having some CD's.  Chaplain will attempt to locate some classical music CD's for pt's radio.    02/12/11 1600  Clinical Encounter Type  Visited With Patient  Visit Type Initial;Psychological support  Referral From Chaplain  Consult/Referral To Chaplain;Nurse  Stress Factors  Patient Stress Factors Major life changes;Loss of control;Health changes

## 2011-02-16 LAB — CULTURE, BLOOD (ROUTINE X 2)
Culture  Setup Time: 201211281332
Culture  Setup Time: 201211281333

## 2012-06-26 ENCOUNTER — Encounter: Payer: Self-pay | Admitting: Nurse Practitioner

## 2012-06-26 ENCOUNTER — Non-Acute Institutional Stay (SKILLED_NURSING_FACILITY): Payer: Medicare Other | Admitting: Nurse Practitioner

## 2012-06-26 DIAGNOSIS — I10 Essential (primary) hypertension: Secondary | ICD-10-CM

## 2012-06-26 DIAGNOSIS — L309 Dermatitis, unspecified: Secondary | ICD-10-CM | POA: Insufficient documentation

## 2012-06-26 DIAGNOSIS — Z8673 Personal history of transient ischemic attack (TIA), and cerebral infarction without residual deficits: Secondary | ICD-10-CM

## 2012-06-26 DIAGNOSIS — L259 Unspecified contact dermatitis, unspecified cause: Secondary | ICD-10-CM

## 2012-06-26 DIAGNOSIS — R1319 Other dysphagia: Secondary | ICD-10-CM

## 2012-06-26 NOTE — Assessment & Plan Note (Signed)
Patient is stable; continue current regimen. Will monitor and make changes as necessary.  

## 2012-06-26 NOTE — Progress Notes (Signed)
Patient ID: Nicholas Baxter, male   DOB: 10/14/1918, 79 y.o.   MRN: [id number]  Chief Complaint: medical management of chronic conditions   HPI:   79 year old male being followed by hospice care 290.40-DEMENTIA, VASCULAR, UNCOMPLICATED The patient has had no change in behavior. The dementia remains stable and continues to function adequately in the current living environment with supervision. 311-DEPRESSIVE DISORDER NEC unchanged   taking celexa 20mg  daily  401.9-HTN UNSPECIFIED The blood pressure readings taken outside the office since the last visit have been in the target range. No complications noted from the medication presently being used. taking lopressor 25mg  BID 438.9-CVA, LATE EFFECT The neurological status is stable.The patient is confused, has weakness, has difficulty swallowing. Currently the patient is off all medication. 530.11-GERD appears stable on Zantac 150mg  BID 564.00-CONSTIPATION The symptoms are stable. Denies symptoms.currently on senna s 2 tabs BID  692.9-DERMATITIS, UNSPECIFIED   was taking off mycolog II cream and started on micononizole cream- hospice staff reports worsening of rash however pt reports it is not itchy  787.29-OTHER DYSPHAGIA  currently on a puree diet with medications crushed.   Review of Systems:  Unable to obtain ROS due to advanced dementia   Medications: Patient's Medications  New Prescriptions   No medications on file  Previous Medications   ACETAMINOPHEN (TYLENOL) 325 MG TABLET    Take 650 mg by mouth every 6 (six) hours as needed. pain   BISACODYL (DULCOLAX) 10 MG SUPPOSITORY    Place 10 mg rectally as needed. For constipation. Give only if no relief from Milk of Magnesia.   CITALOPRAM (CELEXA) 20 MG TABLET    Take 20 mg by mouth daily.     DOCUSATE SODIUM (COLACE) 100 MG CAPSULE    Take 100 mg by mouth at bedtime.     MAGNESIUM HYDROXIDE (MILK OF MAGNESIA) 400 MG/5ML SUSPENSION    Take 30 mLs by mouth daily as needed. constipation   METOPROLOL (LOPRESSOR) 50 MG TABLET    Take 25 mg by mouth 2 (two) times daily.    OXYCODONE-ACETAMINOPHEN (PERCOCET) 5-325 MG PER TABLET    Take 1 tablet by mouth every 4 (four) hours as needed for pain.   RANITIDINE (ZANTAC) 150 MG TABLET    Take 150 mg by mouth 2 (two) times daily.   SENNA (SENOKOT) 8.6 MG TABS    Take 2 tablets by mouth daily.    SODIUM PHOSPHATES (FLEET ENEMA RE)    Place 1 applicator rectally once as needed. For constipation. Not relieved by Milk of Magnesia or Bisacodyl suppository  Modified Medications   No medications on file  Discontinued Medications   B COMPLEX-C (B-COMPLEX WITH VITAMIN C) TABLET    Take 1 tablet by mouth daily.    ERGOCALCIFEROL (VITAMIN D2) 2000 UNITS TABS    Take 1 tablet by mouth 2 (two) times daily.    FERROUS SULFATE 325 (65 FE) MG TABLET    Take 325 mg by mouth daily with breakfast.     HYDROCODONE-ACETAMINOPHEN (NORCO) 5-325 MG PER TABLET    Take 1 tablet by mouth every 6 (six) hours as needed. pain   LIDOCAINE (LIDODERM) 5 %    Place 1 patch onto the skin every 12 (twelve) hours. Remove & Discard patch within 12 hours or as directed by MD   MELATONIN 3 MG TABS    Take 1 tablet by mouth daily.    PANTOPRAZOLE (PROTONIX) 40 MG TABLET    Take 40 mg by mouth daily.  PSYLLIUM (REGULOID) 0.52 G CAPSULE    Take 0.52 g by mouth at bedtime.    TRAZODONE (DESYREL) 50 MG TABLET    Take 50 mg by mouth at bedtime.    VITAMIN C (ASCORBIC ACID) 500 MG TABLET    Take 500 mg by mouth daily.       Physical Exam: Physical Exam  Constitutional:  Chronically ill appearing gentlemen, in NAD   Eyes: Conjunctivae and EOM are normal. Pupils are equal, round, and reactive to light.  Cardiovascular: Normal rate, regular rhythm and normal heart sounds.   Pulmonary/Chest: Effort normal and breath sounds normal.  Abdominal: Soft. Bowel sounds are normal.  Neurological: He is alert.  Skin: Skin is warm and dry. Rash (to bilateral thighs; rough patchy area  without redness or edema) noted. He is not diaphoretic.    Filed Vitals:   06/26/12 1959  BP: 120/68  Pulse: 67  Temp: 96.6 F (35.9 C)  Resp: 20  Weight: 109 lb 9.6 oz (49.714 kg)       Assessment/Plan HTN (hypertension) Patient is stable; continue current regimen. Will monitor and make changes as necessary.   Other dysphagia Stable. No signs of aspiration   History of stroke Patient is stable; continue current regimen. Will monitor and make changes as necessary.   Dermatitis Will cont miconazole cream BID       Family/ staff Communication:  Cont to monitor rash- will adjust treatment as necessary    Goals of care: pt is under hospice care- comfort is goal

## 2012-06-26 NOTE — Assessment & Plan Note (Signed)
Will cont miconazole cream BID

## 2012-06-26 NOTE — Assessment & Plan Note (Signed)
Stable. No signs of aspiration

## 2012-07-27 ENCOUNTER — Encounter: Payer: Self-pay | Admitting: Nurse Practitioner

## 2012-07-27 ENCOUNTER — Non-Acute Institutional Stay (SKILLED_NURSING_FACILITY): Payer: Medicare Other | Admitting: Nurse Practitioner

## 2012-07-27 DIAGNOSIS — Z8673 Personal history of transient ischemic attack (TIA), and cerebral infarction without residual deficits: Secondary | ICD-10-CM

## 2012-07-27 DIAGNOSIS — L309 Dermatitis, unspecified: Secondary | ICD-10-CM

## 2012-07-27 DIAGNOSIS — I10 Essential (primary) hypertension: Secondary | ICD-10-CM

## 2012-07-27 DIAGNOSIS — R1319 Other dysphagia: Secondary | ICD-10-CM

## 2012-07-27 DIAGNOSIS — L259 Unspecified contact dermatitis, unspecified cause: Secondary | ICD-10-CM

## 2012-07-27 NOTE — Progress Notes (Signed)
Patient ID: Nicholas Baxter, male   DOB: [birth year], 79 y.o.   MRN: [id number]  Chief Complaint: medical management of chronic conditions   HPI:  79 year old male being followed by hospice care. Staff currently without any acute concerns.  290.40-DEMENTIA, VASCULAR, UNCOMPLICATED The patient has had no change in behavior. The dementia remains stable and continues to function adequately in the current living environment with supervision.  311-DEPRESSIVE DISORDER NEC unchanged taking celexa 20mg  daily  401.9-HTN UNSPECIFIED The blood pressure readings taken outside the office since the last visit have been in the target range. No complications noted from the medication presently being used. taking lopressor 25mg  BID  438.9-CVA, LATE EFFECT The neurological status is stable.The patient is confused, has weakness, has difficulty swallowing. Currently the patient is off all medication.  530.11-GERD appears stable on Zantac 150mg  BID  564.00-CONSTIPATION The symptoms are stable. Denies symptoms.currently on senna s 2 tabs BID  692.9-DERMATITIS, UNSPECIFIED currently taking nystatin and doxycyline  787.29-OTHER DYSPHAGIA currently on a puree diet with medications crushed.    Review of Systems:  Review of Systems  Unable to perform ROS: dementia    Medications: Patient's Medications  New Prescriptions   No medications on file  Previous Medications   ACETAMINOPHEN (TYLENOL) 325 MG TABLET    Take 650 mg by mouth every 6 (six) hours as needed. pain   BISACODYL (DULCOLAX) 10 MG SUPPOSITORY    Place 10 mg rectally as needed. For constipation. Give only if no relief from Milk of Magnesia.   CITALOPRAM (CELEXA) 20 MG TABLET    Take 20 mg by mouth daily.     DOCUSATE SODIUM (COLACE) 100 MG CAPSULE    Take 100 mg by mouth at bedtime.     MAGNESIUM HYDROXIDE (MILK OF MAGNESIA) 400 MG/5ML SUSPENSION    Take 30 mLs by mouth daily as needed. constipation   METOPROLOL (LOPRESSOR) 50 MG TABLET    Take 25 mg by  mouth 2 (two) times daily.    OXYCODONE-ACETAMINOPHEN (PERCOCET) 5-325 MG PER TABLET    Take 1 tablet by mouth every 4 (four) hours as needed for pain.   RANITIDINE (ZANTAC) 150 MG TABLET    Take 150 mg by mouth 2 (two) times daily.   SENNA (SENOKOT) 8.6 MG TABS    Take 2 tablets by mouth daily.    SODIUM PHOSPHATES (FLEET ENEMA RE)    Place 1 applicator rectally once as needed. For constipation. Not relieved by Milk of Magnesia or Bisacodyl suppository  Modified Medications   No medications on file  Discontinued Medications   No medications on file     Physical Exam: Physical Exam  Nursing note and vitals reviewed. Constitutional: Vital signs are normal. No distress.  Thin body habitus   HENT:  Head: Normocephalic and atraumatic.  Eyes: EOM are normal. Pupils are equal, round, and reactive to light.  Cardiovascular: Normal rate, regular rhythm, normal heart sounds and intact distal pulses.   Pulmonary/Chest: Effort normal and breath sounds normal.  Abdominal: Soft. Bowel sounds are normal.  Skin: Skin is warm and dry. He is not diaphoretic.   Rash (to bilateral thighs; rough patchy area without redness or edema)     Filed Vitals:   07/27/12 1654  BP: 158/74  Pulse: 72  Temp: 97 F (36.1 C)  Resp: 20  Weight: 101 lb 6.4 oz (45.995 kg)         Assessment/Plan Dermatitis Improved on recent medications.   History of stroke Stable.  HTN (hypertension) Patient is stable; continue current regimen. Will monitor and make changes as necessary.   Other dysphagia No signs of aspiration. Will cont to monitor

## 2012-08-07 NOTE — Assessment & Plan Note (Signed)
Stable

## 2012-08-07 NOTE — Assessment & Plan Note (Signed)
Improved on recent medications.

## 2012-08-07 NOTE — Assessment & Plan Note (Signed)
No signs of aspiration. Will cont to monitor

## 2012-08-07 NOTE — Assessment & Plan Note (Signed)
Patient is stable; continue current regimen. Will monitor and make changes as necessary.  

## 2012-08-27 IMAGING — CT CT HEAD W/O CM
3 of 4 series · 17 of 30 positions shown, 19 images · non-contrast
Comparison: None.

CLINICAL DATA: Altered mental status

CT HEAD WITHOUT CONTRAST
TECHNIQUE: Contiguous axial images were obtained from the base of
the skull through the vertex without contrast.

[Series 2: head w/o · axial · non-contrast · 0.45mm/px · z∈[+1373,+1488]mm · 7 of 31 slices shown, 9 images (1 of 2)]
[im 4/31  brain]
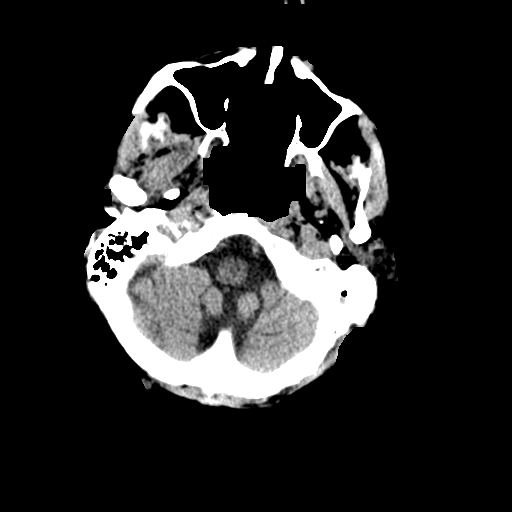
[im 4/31  bone]
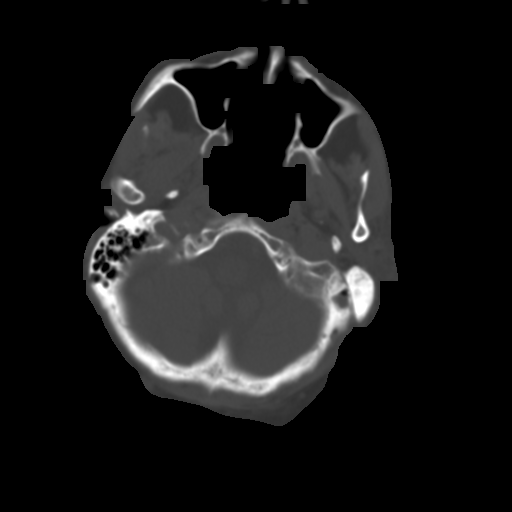
[im 8/31  brain]
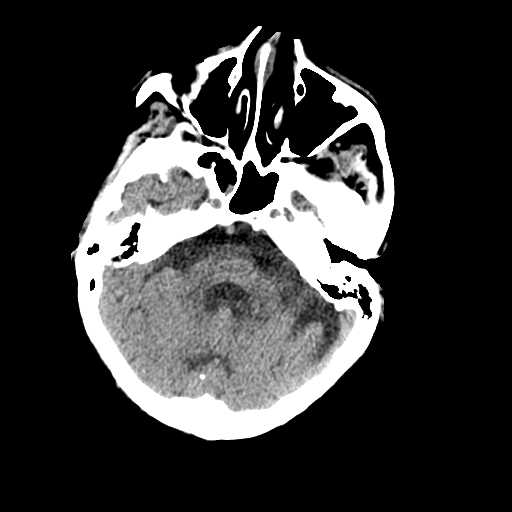
[im 12/31  brain]
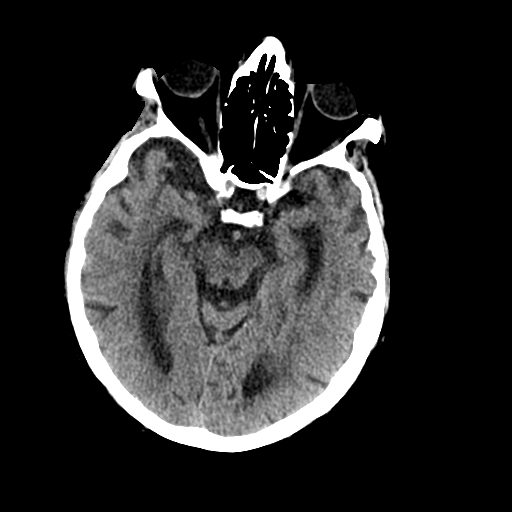
[im 16/31  brain]
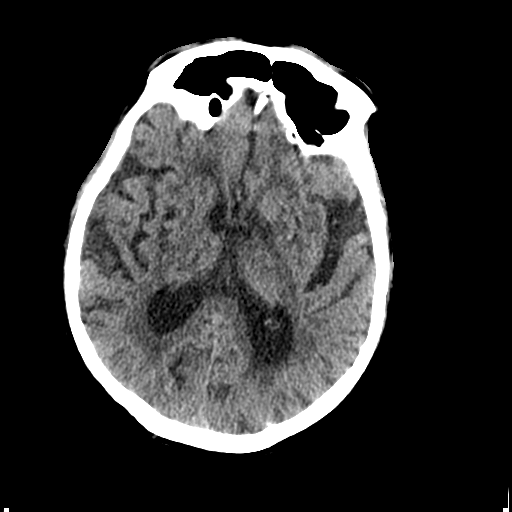
[im 19/31  brain]
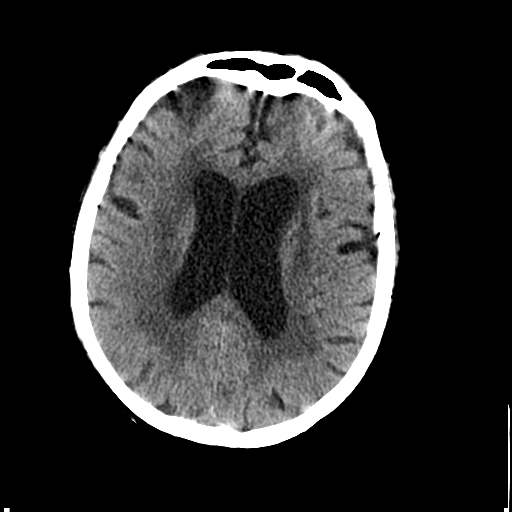
[im 19/31  bone]
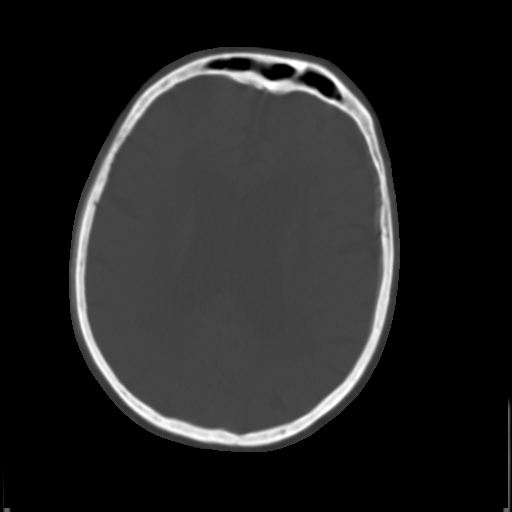
[im 23/31  brain]
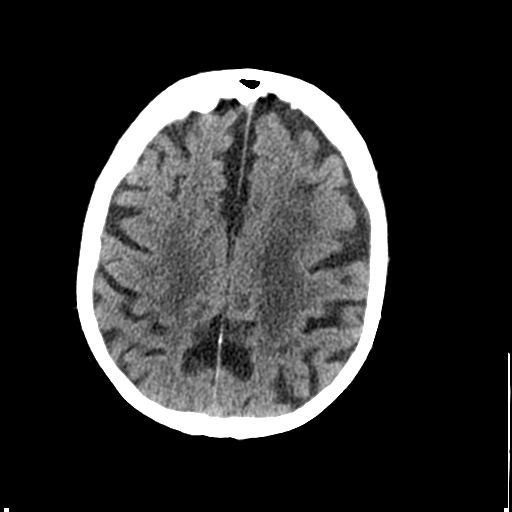
[im 27/31  brain]
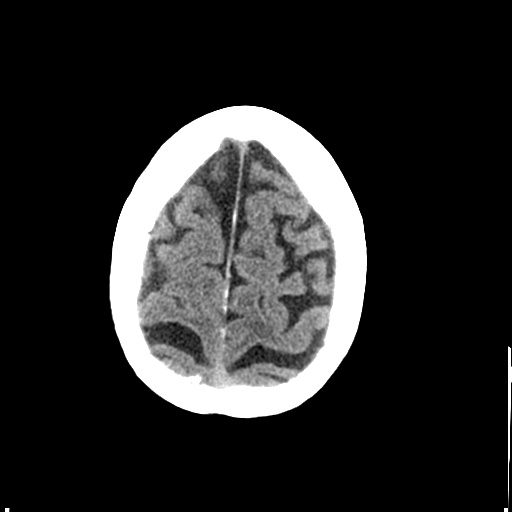

[Series 3: head w/o · axial · non-contrast · 0.45mm/px · z∈[+1429,+1489]mm · 4 of 20 slices shown (2 of 2)]
[im 4/20  brain]
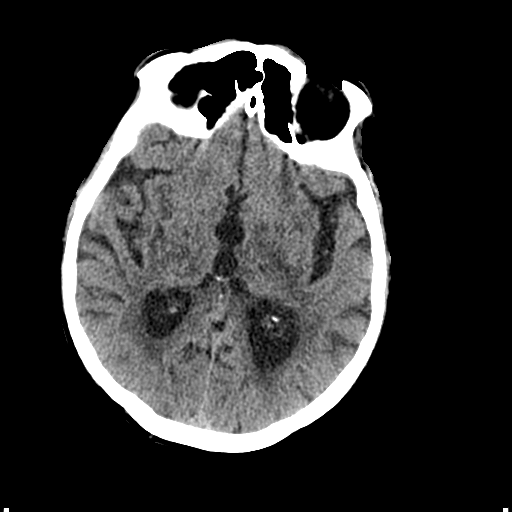
[im 8/20  brain]
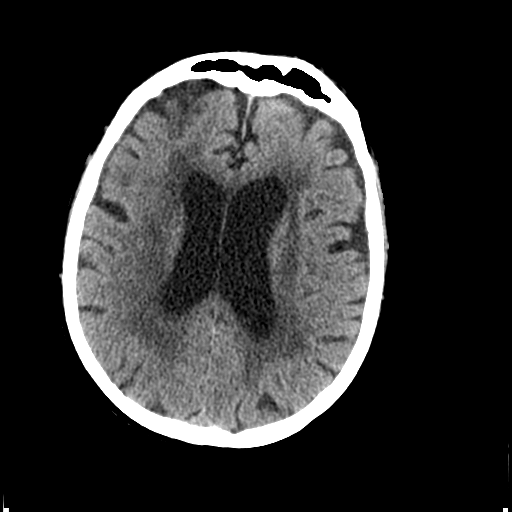
[im 12/20  brain]
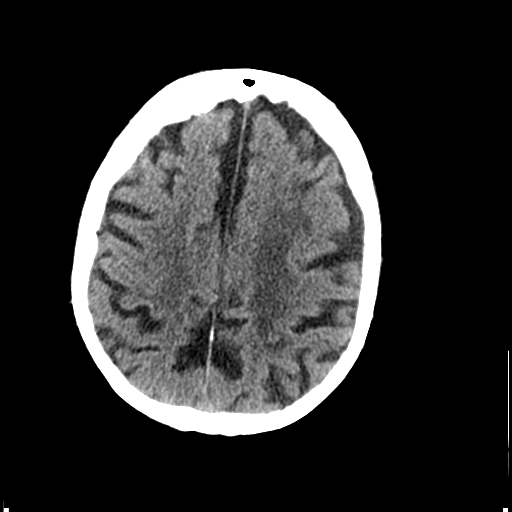
[im 16/20  brain]
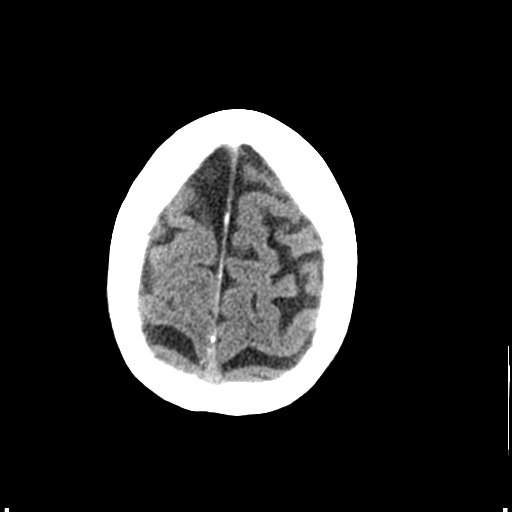

[Series 4: bone windows · axial · 0.45mm/px · z∈[+1373,+1468]mm · 6 of 31 slices shown]
[im 4/31  bone]
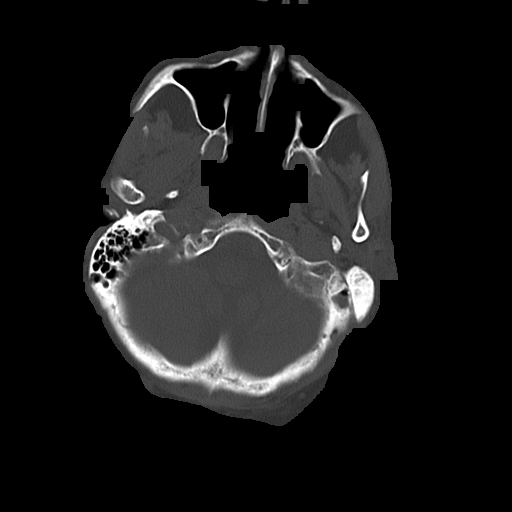
[im 8/31  bone]
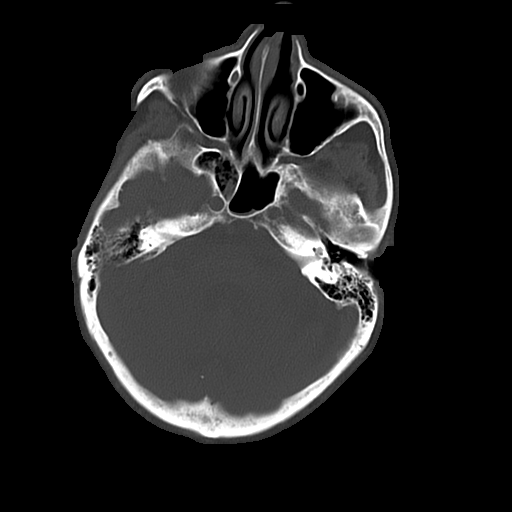
[im 12/31  bone]
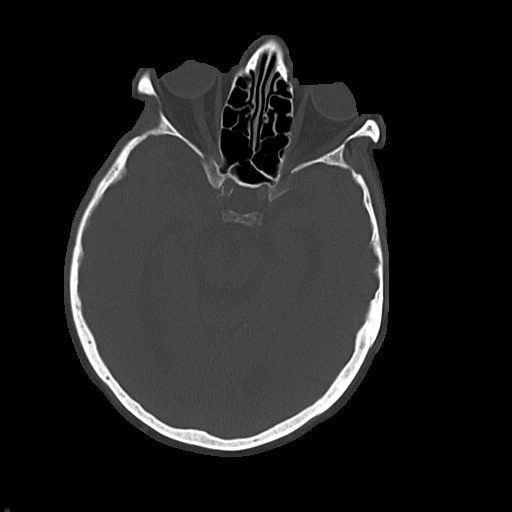
[im 16/31  bone]
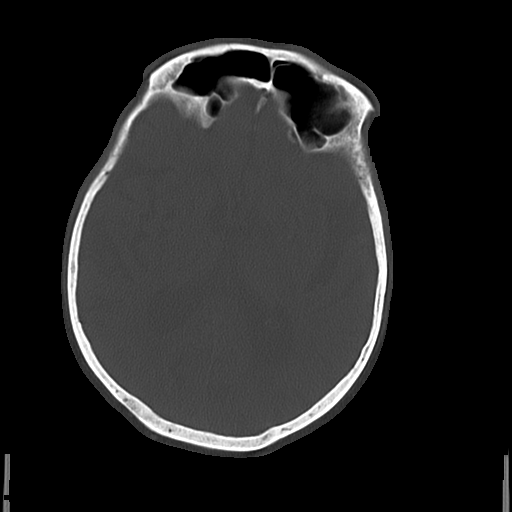
[im 19/31  bone]
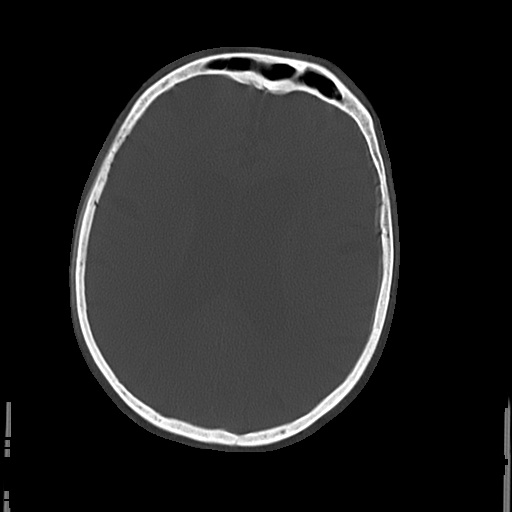
[im 23/31  bone]
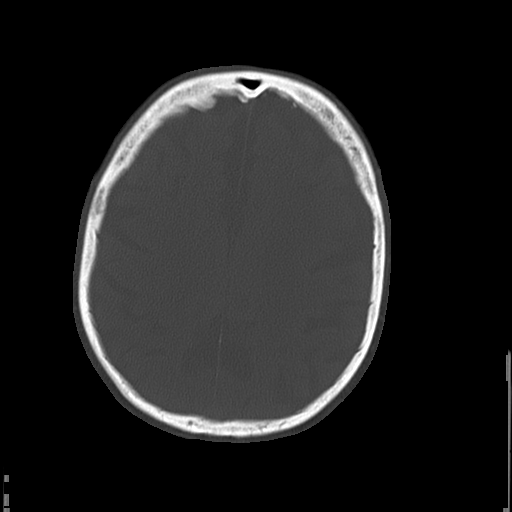

[17 of 30 positions shown; findings below may reference images not displayed]

FINDINGS: Chronic ischemic changes and global atrophy are present.
No mass effect, midline shift, or acute intracranial hemorrhage.
Mucous material partially fills the right sphenoid sinus. Right
mastoid air cells are clear.  Minimal fluid in the left mastoid air
cells.  Cranium is intact.
IMPRESSION: Other than fluid in the left mastoid air cells and inflammatory
changes in the sphenoid sinus, there is no acute intracranial
pathology.  Chronic ischemic changes and atrophy are noted.

## 2012-09-11 ENCOUNTER — Non-Acute Institutional Stay (SKILLED_NURSING_FACILITY): Payer: Medicare Other | Admitting: Nurse Practitioner

## 2012-09-11 DIAGNOSIS — R1319 Other dysphagia: Secondary | ICD-10-CM

## 2012-09-11 DIAGNOSIS — L309 Dermatitis, unspecified: Secondary | ICD-10-CM

## 2012-09-11 DIAGNOSIS — L259 Unspecified contact dermatitis, unspecified cause: Secondary | ICD-10-CM

## 2012-09-11 DIAGNOSIS — I10 Essential (primary) hypertension: Secondary | ICD-10-CM

## 2012-09-11 DIAGNOSIS — Z8673 Personal history of transient ischemic attack (TIA), and cerebral infarction without residual deficits: Secondary | ICD-10-CM

## 2012-09-11 NOTE — Progress Notes (Signed)
Patient ID: Nicholas Baxter, male   DOB: [birth year], 79 y.o.   MRN: [id number]  Nursing Home Location:  [facility]   Place of Service: SNF (31)   Chief Complaint: medical management of chronic conditions   HPI:  79 year old male being followed by hospice care who is a long term resident of [facility] is beeing seen today for routine follow up. Staff currently without any concerns.  290.40-DEMENTIA, VASCULAR, UNCOMPLICATED The patient has had no change in behavior. The dementia remains stable and continues to function adequately in the current living environment with supervision.  311-DEPRESSIVE DISORDER NEC unchanged taking celexa 20mg  daily  401.9-HTN UNSPECIFIED The blood pressure readings taken outside the office since the last visit have been in the target range. No complications noted from the medication presently being used. taking lopressor 25mg  BID  438.9-CVA, LATE EFFECT The neurological status is stable.The patient is confused, has weakness, has difficulty swallowing. Currently the patient is off all medication.  530.11-GERD appears stable on Zantac 150mg  BID  564.00-CONSTIPATION The symptoms are stable. Denies symptoms.currently on senna s 2 tabs BID  692.9-DERMATITIS, UNSPECIFIED ongoing; cont nystatin   787.29-OTHER DYSPHAGIA currently on a puree diet with medications crushed. No signs of aspiration    Review of Systems:  Review of Systems  Unable to perform ROS: dementia     Medications: Patient's Medications  New Prescriptions   No medications on file  Previous Medications   ACETAMINOPHEN (TYLENOL) 325 MG TABLET    Take 650 mg by mouth every 6 (six) hours as needed. pain   BISACODYL (DULCOLAX) 10 MG SUPPOSITORY    Place 10 mg rectally as needed. For constipation. Give only if no relief from Milk of Magnesia.   CITALOPRAM (CELEXA) 20 MG TABLET    Take 20 mg by mouth daily.     DOCUSATE SODIUM (COLACE) 100 MG CAPSULE    Take 100 mg by mouth at bedtime.     MAGNESIUM HYDROXIDE (MILK OF MAGNESIA) 400 MG/5ML SUSPENSION    Take 30 mLs by mouth daily as needed. constipation   METOPROLOL (LOPRESSOR) 50 MG TABLET    Take 25 mg by mouth 2 (two) times daily.    OXYCODONE-ACETAMINOPHEN (PERCOCET) 5-325 MG PER TABLET    Take 1 tablet by mouth every 4 (four) hours as needed for pain.   RANITIDINE (ZANTAC) 150 MG TABLET    Take 150 mg by mouth 2 (two) times daily.   SENNA (SENOKOT) 8.6 MG TABS    Take 2 tablets by mouth daily.    SODIUM PHOSPHATES (FLEET ENEMA RE)    Place 1 applicator rectally once as needed. For constipation. Not relieved by Milk of Magnesia or Bisacodyl suppository  Modified Medications   No medications on file  Discontinued Medications   No medications on file     Physical Exam:  Filed Vitals:   09/11/12 1606  BP: 157/73  Pulse: 60  Temp: 98.2 F (36.8 C)  Resp: 20  Weight: 100 lb 9.6 oz (45.632 kg)    Physical Exam  Nursing note and vitals reviewed. Constitutional: No distress.  Thin male in NAD  HENT:  Head: Normocephalic and atraumatic.  Right Ear: External ear normal.  Left Ear: External ear normal.  Cardiovascular: Normal rate, regular rhythm and normal heart sounds.   Pulmonary/Chest: Effort normal and breath sounds normal. No respiratory distress.  Abdominal: Soft. Bowel sounds are normal. He exhibits no distension. There is no tenderness.  Musculoskeletal: He exhibits no edema  and no tenderness.  Neurological: He is alert.  Skin: Skin is warm and dry. Rash (remains on right thigh) noted. He is not diaphoretic.  Psychiatric:  Pleasantly confused      Assessment/Plan   1.   Dermatitis 692.9     Unchanged- will cont nystatin    2.    HTN (hypertension) 401.9     Stable; To cont current medications    3.    History of stroke V12.54     Stable    4.   Other dysphagia   Stable on current diet    Labs/tests ordered Follow up cbc and cmp

## 2012-10-04 ENCOUNTER — Other Ambulatory Visit: Payer: Self-pay | Admitting: Geriatric Medicine

## 2012-10-04 MED ORDER — OXYCODONE-ACETAMINOPHEN 5-325 MG PO TABS: 1.0000 | ORAL_TABLET | ORAL | Status: AC | PRN

## 2012-10-09 ENCOUNTER — Non-Acute Institutional Stay (SKILLED_NURSING_FACILITY): Payer: Medicare Other | Admitting: Nurse Practitioner

## 2012-10-09 DIAGNOSIS — K5900 Constipation, unspecified: Secondary | ICD-10-CM

## 2012-10-09 DIAGNOSIS — I10 Essential (primary) hypertension: Secondary | ICD-10-CM

## 2012-10-09 DIAGNOSIS — F329 Major depressive disorder, single episode, unspecified: Secondary | ICD-10-CM

## 2012-10-09 DIAGNOSIS — M1990 Unspecified osteoarthritis, unspecified site: Secondary | ICD-10-CM

## 2012-10-09 NOTE — Progress Notes (Signed)
Patient ID: Nicholas Baxter, male   DOB: 1918/12/11, 79 y.o.   MRN: [id number]  Nursing Home Location:  [facility]   Place of Service: SNF (31)  Chief Complaint  Patient presents with  . Medical Managment of Chronic Issues    HPI:  79 year old male being followed by hospice care who is a long term resident of [facility] is being seen today for routine follow up. Staff currently without any concerns.  Reassessment of ongoing issues  DEMENTIA, VASCULAR, UNCOMPLICATED The patient has had no change in behavior. The dementia remains stable and continues to function adequately in the current living environment with supervision.  DEPRESSIVE DISORDER NEC unchanged taking celexa 20mg  daily  HTN UNSPECIFIED The blood pressure readings taken outside the office since the last visit have been in the target range. No complications noted from the medication presently being used. taking lopressor 25mg  BID  CVA, LATE EFFECT The neurological status is stable.The patient is confused, has weakness, has difficulty swallowing. Currently the patient is off all medication.  DYSPHAGIA currently on a puree diet with medications crushed. No signs of aspiration GERD appears stable on Zantac 150mg  BID  CONSTIPATION The symptoms are stable. Denies symptoms.currently on senna s 2 tabs BID  DERMATITIS, UNSPECIFIED ongoing; on nystatin     Review of Systems:  Unable to obtain due to dementia    Medications: Patient's Medications  New Prescriptions   No medications on file  Previous Medications   ACETAMINOPHEN (TYLENOL) 325 MG TABLET    Take 650 mg by mouth every 6 (six) hours as needed. pain   BISACODYL (DULCOLAX) 10 MG SUPPOSITORY    Place 10 mg rectally as needed. For constipation. Give only if no relief from Milk of Magnesia.   CITALOPRAM (CELEXA) 20 MG TABLET    Take 20 mg by mouth daily.     DOCUSATE SODIUM (COLACE) 100 MG CAPSULE    Take 100 mg by mouth at bedtime.     MAGNESIUM HYDROXIDE (MILK  OF MAGNESIA) 400 MG/5ML SUSPENSION    Take 30 mLs by mouth daily as needed. constipation   METOPROLOL (LOPRESSOR) 50 MG TABLET    Take 25 mg by mouth 2 (two) times daily.    OXYCODONE-ACETAMINOPHEN (PERCOCET) 5-325 MG PER TABLET    Take 1 tablet by mouth every 4 (four) hours as needed for pain.   RANITIDINE (ZANTAC) 150 MG TABLET    Take 150 mg by mouth 2 (two) times daily.   SENNA (SENOKOT) 8.6 MG TABS    Take 2 tablets by mouth daily.    SODIUM PHOSPHATES (FLEET ENEMA RE)    Place 1 applicator rectally once as needed. For constipation. Not relieved by Milk of Magnesia or Bisacodyl suppository  Modified Medications   No medications on file  Discontinued Medications   No medications on file     Physical Exam:  Filed Vitals:   10/09/12 1222  BP: 121/81  Pulse: 56  Temp: 97.7 F (36.5 C)  Resp: 18  Weight: 99 lb (44.906 kg)   GENERAL APPEARANCE: Alert, conversant. Appropriately groomed. No acute distress.  SKIN: No diaphoresis, chronic rash,  And ulcer on right 2nd toe HEAD: Normocephalic, atraumatic  EYES: Conjunctiva/lids clear. Pupils round, reactive. EOMs intact.  EARS: External exam WNL, canals clear. Hearing grossly normal.  NOSE: No deformity or discharge.  MOUTH/THROAT: Lips w/o lesions. Mouth and throat normal. Tongue moist, w/o lesion.  NECK: No thyroid tenderness, enlargement or nodule  RESPIRATORY: Breathing is  even, unlabored. Lung sounds are clear   CARDIOVASCULAR: Heart RRR no murmurs, rubs or gallops. No peripheral edema.  ARTERIAL: radial pulse 2+  GASTROINTESTINAL: Abdomen is soft, non-tender, not distended w/ normal bowel sounds. No mass, ventral or inguinal hernia. No organomegally GENITOURINARY: Bladder non tender, not distended  MUSCULOSKELETAL: denies pain  NEUROLOGIC: Oriented to self. Moves all extremities no tremor. PSYCHIATRIC: Mood and affect appropriate to situation, no behavioral issues  Labs reviewed/Significant Diagnostic Results: CBC NO Diff  (Complete Blood Count)       Result: 09/12/2012 3:57 PM    ( Status: F )            WBC  7.3        4.0-10.5  K/uL  SLN       RBC  4.05     L  4.22-5.81  MIL/uL  SLN       Hemoglobin  12.0     L  13.0-17.0  g/dL  SLN       Hematocrit  35.2     L  39.0-52.0  %  SLN       MCV  86.9        78.0-100.0  fL  SLN       MCH  29.6        26.0-34.0  pg  SLN       MCHC  34.1        30.0-36.0  g/dL  SLN       RDW  95.2     H  11.5-15.5  %  SLN       Platelet Count  168        150-400  K/uL  SLN      Comprehensive Metabolic Panel       Result: 09/12/2012 4:03 PM    ( Status: F )            Sodium  140        135-145  mEq/L  SLN       Potassium  4.3        3.5-5.3  mEq/L  SLN       Chloride  105        96-112  mEq/L  SLN       CO2  28        19-32  mEq/L  SLN       Glucose  88        70-99  mg/dL  SLN       BUN  17        6-23  mg/dL  SLN       Creatinine  1.30        0.50-1.35  mg/dL  SLN       Bilirubin, Total  0.6        0.3-1.2  mg/dL  SLN       Alkaline Phosphatase  52        39-117  U/L  SLN       AST/SGOT  19        0-37  U/L  SLN       ALT/SGPT  14        0-53  U/L  SLN       Total Protein  5.9     L  6.0-8.3  g/dL  SLN       Albumin  3.4     L  3.5-5.2  g/dL  SLN  Calcium  8.4        8.4-10.5  mg/dL  SLN       Assessment/Plan   HTN (hypertension)- will decrease metoprolol to 12.5 BID due to decrease in HR- BP stables  OA (osteoarthritis)- stable on PRNs only at this time. Pt without complaints of pain  Depression- Patients mood is stable; continue current regimen. Will monitor and make changes as necessary.  Unspecified constipation- Patients constipation is stable; continue current regimen. Will monitor and make changes as necessary.

## 2012-10-26 ENCOUNTER — Other Ambulatory Visit: Payer: Self-pay | Admitting: Geriatric Medicine

## 2012-11-13 DEATH — deceased

## 2021-07-14 ENCOUNTER — Telehealth: Payer: Self-pay

## 2022-01-15 NOTE — Telephone Encounter (Signed)
Error
